# Patient Record
Sex: Male | Born: 1965 | Race: White | Smoking: Former smoker
Health system: Southern US, Community
[De-identification: ages and names within clinical notes are randomized; demographics above are authoritative.]

## PROBLEM LIST (undated history)

## (undated) DIAGNOSIS — N289 Disorder of kidney and ureter, unspecified: Secondary | ICD-10-CM

## (undated) DIAGNOSIS — K219 Gastro-esophageal reflux disease without esophagitis: Secondary | ICD-10-CM

## (undated) DIAGNOSIS — Z87442 Personal history of urinary calculi: Secondary | ICD-10-CM

## (undated) HISTORY — PX: KNEE ARTHROSCOPY: SUR90

## (undated) HISTORY — DX: Personal history of urinary calculi: Z87.442

## (undated) HISTORY — PX: ESOPHAGEAL DILATION: SHX303

---

## 2007-05-12 ENCOUNTER — Ambulatory Visit: Payer: Self-pay | Admitting: Gastroenterology

## 2008-03-19 ENCOUNTER — Ambulatory Visit: Payer: Self-pay | Admitting: Gastroenterology

## 2013-08-14 DIAGNOSIS — K219 Gastro-esophageal reflux disease without esophagitis: Secondary | ICD-10-CM | POA: Insufficient documentation

## 2013-08-14 DIAGNOSIS — E291 Testicular hypofunction: Secondary | ICD-10-CM | POA: Insufficient documentation

## 2013-08-14 DIAGNOSIS — F419 Anxiety disorder, unspecified: Secondary | ICD-10-CM | POA: Insufficient documentation

## 2015-03-01 ENCOUNTER — Other Ambulatory Visit: Payer: Self-pay | Admitting: Family Medicine

## 2015-03-01 DIAGNOSIS — R748 Abnormal levels of other serum enzymes: Secondary | ICD-10-CM

## 2015-12-19 DIAGNOSIS — G479 Sleep disorder, unspecified: Secondary | ICD-10-CM | POA: Insufficient documentation

## 2015-12-19 DIAGNOSIS — R251 Tremor, unspecified: Secondary | ICD-10-CM | POA: Insufficient documentation

## 2016-01-17 ENCOUNTER — Emergency Department: Payer: Managed Care, Other (non HMO)

## 2016-01-17 ENCOUNTER — Emergency Department
Admission: EM | Admit: 2016-01-17 | Discharge: 2016-01-18 | Disposition: A | Discharge status: Home / Self Care | Payer: Managed Care, Other (non HMO) | Attending: Emergency Medicine | Admitting: Emergency Medicine

## 2016-01-17 DIAGNOSIS — F419 Anxiety disorder, unspecified: Secondary | ICD-10-CM

## 2016-01-17 DIAGNOSIS — K2100 Gastro-esophageal reflux disease with esophagitis, without bleeding: Secondary | ICD-10-CM

## 2016-01-17 DIAGNOSIS — R109 Unspecified abdominal pain: Secondary | ICD-10-CM

## 2016-01-17 DIAGNOSIS — R079 Chest pain, unspecified: Secondary | ICD-10-CM | POA: Diagnosis present

## 2016-01-17 DIAGNOSIS — Z79899 Other long term (current) drug therapy: Secondary | ICD-10-CM | POA: Insufficient documentation

## 2016-01-17 HISTORY — DX: Disorder of kidney and ureter, unspecified: N28.9

## 2016-01-17 HISTORY — DX: Gastro-esophageal reflux disease without esophagitis: K21.9

## 2016-01-17 LAB — BASIC METABOLIC PANEL
Anion gap: 7 (ref 5–15)
BUN: 13 mg/dL (ref 6–20)
CHLORIDE: 102 mmol/L (ref 101–111)
CO2: 27 mmol/L (ref 22–32)
Calcium: 9.2 mg/dL (ref 8.9–10.3)
Creatinine, Ser: 1.1 mg/dL (ref 0.61–1.24)
GFR calc Af Amer: >60 mL/min (ref >60)
GFR calc non Af Amer: >60 mL/min (ref >60)
GLUCOSE: 107 mg/dL — AB (ref 65–99)
POTASSIUM: 3.8 mmol/L (ref 3.5–5.1)
Sodium: 136 mmol/L (ref 135–145)

## 2016-01-17 LAB — LIPASE, BLOOD: Lipase: 24 U/L (ref 11–51)

## 2016-01-17 LAB — TROPONIN I
Troponin I: <0.03 ng/mL (ref <0.03)
Troponin I: <0.03 ng/mL (ref <0.03)

## 2016-01-17 LAB — CBC
HEMATOCRIT: 42.8 % (ref 40.0–52.0)
Hemoglobin: 14.8 g/dL (ref 13.0–18.0)
MCH: 32.2 pg (ref 26.0–34.0)
MCHC: 34.6 g/dL (ref 32.0–36.0)
MCV: 93.2 fL (ref 80.0–100.0)
Platelets: 219 10*3/uL (ref 150–440)
RBC: 4.6 MIL/uL (ref 4.40–5.90)
RDW: 13.6 % (ref 11.5–14.5)
WBC: 9.8 10*3/uL (ref 3.8–10.6)

## 2016-01-17 LAB — HEPATIC FUNCTION PANEL
ALK PHOS: 60 U/L (ref 38–126)
ALT: 57 U/L (ref 17–63)
AST: 34 U/L (ref 15–41)
Albumin: 4.1 g/dL (ref 3.5–5.0)
BILIRUBIN DIRECT: 0.1 mg/dL (ref 0.1–0.5)
BILIRUBIN INDIRECT: 0.6 mg/dL (ref 0.3–0.9)
Total Bilirubin: 0.7 mg/dL (ref 0.3–1.2)
Total Protein: 7.6 g/dL (ref 6.5–8.1)

## 2016-01-17 LAB — GLUCOSE, CAPILLARY: Glucose-Capillary: 102 mg/dL — ABNORMAL HIGH (ref 65–99)

## 2016-01-17 MED ORDER — GI COCKTAIL ~~LOC~~
30.0000 mL | Freq: Once | ORAL | Status: AC
  Administered 2016-01-17: 30 mL via ORAL

## 2016-01-17 MED ORDER — HYDROMORPHONE HCL 1 MG/ML IJ SOLN
1.0000 mg | Freq: Once | INTRAMUSCULAR | Status: AC
  Administered 2016-01-17: 1 mg via INTRAVENOUS

## 2016-01-17 MED ORDER — OXYCODONE-ACETAMINOPHEN 5-325 MG PO TABS
1.0000 | ORAL_TABLET | ORAL | Status: DC | PRN
  Administered 2016-01-17: 1 via ORAL

## 2016-01-17 MED ORDER — IOPAMIDOL (ISOVUE-370) INJECTION 76%
75.0000 mL | Freq: Once | INTRAVENOUS | Status: AC | PRN
  Administered 2016-01-17: 75 mL via INTRAVENOUS
  Filled 2016-01-17: qty 75

## 2016-01-17 MED ORDER — LORAZEPAM 2 MG/ML IJ SOLN
1.0000 mg | Freq: Once | INTRAMUSCULAR | Status: AC
  Administered 2016-01-17: 1 mg via INTRAVENOUS

## 2016-01-17 MED ORDER — OXYCODONE-ACETAMINOPHEN 5-325 MG PO TABS
ORAL_TABLET | ORAL | Status: AC
  Administered 2016-01-17: 1 via ORAL
  Filled 2016-01-17: qty 1

## 2016-01-17 MED ORDER — KETOROLAC TROMETHAMINE 30 MG/ML IJ SOLN
INTRAMUSCULAR | Status: AC
  Filled 2016-01-17: qty 1

## 2016-01-17 MED ORDER — GI COCKTAIL ~~LOC~~
ORAL | Status: AC
  Filled 2016-01-17: qty 30

## 2016-01-17 MED ORDER — LORAZEPAM 2 MG/ML IJ SOLN
INTRAMUSCULAR | Status: AC
  Administered 2016-01-17: 1 mg via INTRAVENOUS
  Filled 2016-01-17: qty 1

## 2016-01-17 MED ORDER — KETOROLAC TROMETHAMINE 30 MG/ML IJ SOLN
30.0000 mg | Freq: Once | INTRAMUSCULAR | Status: AC
  Administered 2016-01-17: 30 mg via INTRAVENOUS

## 2016-01-17 MED ORDER — HYDROMORPHONE HCL 1 MG/ML IJ SOLN
INTRAMUSCULAR | Status: AC
  Filled 2016-01-17: qty 1

## 2016-01-17 MED ORDER — SODIUM CHLORIDE 0.9 % IV BOLUS (SEPSIS)
1000.0000 mL | Freq: Once | INTRAVENOUS | Status: AC
  Administered 2016-01-17: 1000 mL via INTRAVENOUS

## 2016-01-17 NOTE — ED Notes (Signed)
Patient transported to CT 

## 2016-01-17 NOTE — ED Provider Notes (Addendum)
[facility] Emergency Department Provider Note  ____________________________________________   I have reviewed the triage vital signs and the nursing notes.   HISTORY  Chief Complaint Chest Pain    HPI Jared Wright is a 50 y.o. male who presents today complaining of chest pain. He's had it since yesterday. Gradual onset. Patient does have a history of generalized anxiety disorder and panic attacks but states "this is not panic" he states he has been "trained to deal with pain"via martial arts and the [employer], and he is not worried about the pain. He states he cannot take a deep breath. The pain itself is not pleuritic but he feels he cannot fill his lungs. Patient very anxious. He states that he has had this discomfort which is "all over his chest" which goes towards his neck and down towards his stomach since yesterday. It has never gone away. He states that he has had no fever no chills and no cough. The pain is a "pressure" also a "knot". It hurts when he changes position or pulls up on the bed. "Everything makes it worse"  He does, however, not endorse any exertional symptoms. Mostly, it seems to be positional. Neither does he endorse pain with food. He denies any fall or trauma he denies any abdominal pain especially any lower abdominal pain. He denies any change in bowel or bladder. Denies any cough or vomiting.    Past Medical History:  Diagnosis Date  . Acid reflux   . Renal disorder     There are no active problems to display for this patient.   History reviewed. No pertinent surgical history.  Prior to Admission medications   Not on File    Allergies Sulfur  No family history on file.  Social History Social History  Substance Use Topics  . Smoking status: Never Smoker  . Smokeless tobacco: Not on file  . Alcohol use No    Review of Systems Constitutional: No fever/chills Eyes: No visual changes. ENT: No sore throat. No  stiff neck no neck pain Cardiovascular: See history of present illness regarding chest pain. Respiratory: See history of present illness regarding shortness of breath. Gastrointestinal:   no vomiting.  No diarrhea.  No constipation. Genitourinary: Negative for dysuria. Musculoskeletal: Negative lower extremity swelling Skin: Negative for rash. Neurological: Negative for severe headaches, focal weakness or numbness. 10-point ROS otherwise negative.  ____________________________________________   PHYSICAL EXAM:  VITAL SIGNS: ED Triage Vitals  Enc Vitals Group     BP 01/17/16 1815 (!) 181/79     Pulse Rate 01/17/16 1815 93     Resp 01/17/16 1815 20     Temp 01/17/16 1815 97.8 F (36.6 C)     Temp Source 01/17/16 1815 Oral     SpO2 01/17/16 1815 99 %     Weight 01/17/16 1818 210 lb (95.3 kg)     Height 01/17/16 1818 5\' 8"  (1.727 m)     Head Circumference --      Peak Flow --      Pain Score 01/17/16 1818 8     Pain Loc --      Pain Edu? --      Excl. in [place]? --     Constitutional: Alert and oriented. She is in no medical distress. Is very anxious and upset breathing quickly and quite clearly demonstrating signs of anxiety. Eyes: Conjunctivae are normal. PERRL. EOMI. Head: Atraumatic. Nose: No congestion/rhinnorhea. Mouth/Throat: Mucous membranes are moist.  Oropharynx non-erythematous. Neck:  No stridor.   Nontender with no meningismus Cardiovascular: Tachycardia noted, regular rhythm. Grossly normal heart sounds.  Good peripheral circulation. Respiratory: Normal respiratory effort.  No retractions. Lungs CTAB. Patient is breathing more quickly than normal. Chest: Palpation of the patient's chest, he says "ouch that's the pain right there" and pulls back. This is in both costochondral margins anteriorly near the sternum. No chest no crepitus no evidence of acute lesions. Abdominal: Soft and minimal tenderness to palpation in epigastric right upper quadrant. No distention. No  guarding no rebound Back:  There is no focal tenderness or step off.  there is no midline tenderness there are no lesions noted. there is no CVA tenderness Musculoskeletal: No lower extremity tenderness, no upper extremity tenderness. No joint effusions, no DVT signs strong distal pulses no edema Neurologic:  Normal speech and language. No gross focal neurologic deficits are appreciated.  Skin:  Skin is warm, dry and intact. No rash noted. Psychiatric: Mood and affect are normal. Speech and behavior are normal.  ____________________________________________   LABS (all labs ordered are listed, but only abnormal results are displayed)  Labs Reviewed  BASIC METABOLIC PANEL - Abnormal; Notable for the following:       Result Value   Glucose, Bld 107 (*)    All other components within normal limits  GLUCOSE, CAPILLARY - Abnormal; Notable for the following:    Glucose-Capillary 102 (*)    All other components within normal limits  CBC  TROPONIN I  HEPATIC FUNCTION PANEL  LIPASE, BLOOD   ____________________________________________  EKG  I personally interpreted any EKGs ordered by me or triage Sinus rhythm at 96 bpm in no acute ST elevation or depression, borderline LAD, no acute ischemic changes. ____________________________________________  [id number]  I reviewed any imaging ordered by me or triage that were performed during my shift and, if possible, patient and/or family made aware of any abnormal findings. ____________________________________________   PROCEDURES  Procedure(s) performed: None  Procedures  Critical Care performed: None  ____________________________________________   INITIAL IMPRESSION / ASSESSMENT AND PLAN / ED COURSE  Pertinent labs & imaging results that were available during my care of the patient were reviewed by me and considered in my medical decision making (see chart for details).  The patient has some minimal epigastric and right upper  quadrant discomfort but also is complaining about severe chest pain, he is also clearly very anxious and upset. Troponin is negative despite constant pain since yesterday, leading one to suspect that patient is not having an acute coronary event. EKG is also reassuring. The patient's vital signs reflect possible anxiety attack.   Of course PE or dissection is also on the differential especially since he keeps complaining that he cannot take a deep breath. I have seen this complaint many times and anxiety patient I have not often seen at an PE patients but we'll obtain a CT scan given the dramatic presentation as there is no way we can rule it out without. Patient does have low pretest probability for PE. He has no history of PE or DVT in himself or family no leg swelling no recent travel no exogenous estrogens no surgery no neurologic processes etc. However, he is having severe chest pain as he describes it with shortness of breath. Dissection is also on the differential but again low suspicion. Patient may be having gallbladder problems we'll send liver fraction tests and lipase as a screening test, however I think first we will rule out acute intrathoracic pathology given  his presentation. To my exam however this feels more consistent with possible stomach/gastric/esophagus and/or panic issues. However his abdomen is nonsurgical. He also has reproducible chest wall pain which of course could be the cause  ----------------------------------------- 9:43 PM on 01/17/2016 -----------------------------------------  At this time, there does not appear to be clinical evidence to support the diagnosis of pulmonary embolus, dissection, myocarditis, endocarditis, pericarditis, pericardial tamponade, acute coronary syndrome, pneumothorax, pneumonia, or any other acute intrathoracic pathology that will require admission or acute intervention. Nor is there evidence of any significant intra-abdominal pathology causing  this discomfort.   We have sent a second troponin which I anticipate will likely be negative. I did give the patient a full milligram of Dilaudid which is a strong intravenous pain medication, and he states "it did not touch my pain". He states it started to make him feel better and then he changed position the wrong way and the pain came back. Pain is still in the exact center of his chest. This is reproducible chest wall pain. He is adamant that that what I can reproduce is not the actual cause of his pain.   We'll try other medications to see if we can calm this discomfort down but again severe chest pain in the mid chest with negative CT scan and negative EKG and negative troponin after 2 days is very difficult to account for without recourse to likely muscular skeletal issue. Abdomen is completely benign with no evidence of discomfort after Dilaudid.. CT findings are noted. No evidence of this is causing his pain. Liver function tests are normal lipase is normal white count is normal metabolic panel is normal glucoses 102, we will continue to observe the patient in the emergency department.   Patient has also had multiple different visits to his family medical doctor for reflux disease. He has a history of a hiatal hernia we'll also try GI cocktail. As well as anti-inflammatories and I will try an anxiolytic. Patient insisting that he cannot go home because he is in severe pain that apparently does not respond to Dilaudid.Patient has been seen by his primary care doctor for being "shaky" in the past being worked up for shakiness. Also been evaluated for vision changing in multiple different other possibly somatoform pathologies but today, his complaint is severe chest pain   ----------------------------------------- 9:59 PM on 01/17/2016 -----------------------------------------  Second troponin is negative, we will obtain ultrasound of the gallbladder as a precaution but again low suspicion. The  rest of the abdominal exam is completely benign. Patient is not actually complaining of abdominal pain is complaining of mid to upper chest pain. I have a explained to him that we may not be able to find the cause of his discomfort, however it is our goal to try to rule out significant pathology and thus far we have been able to do so. Clinically, my suspicion for any acute intrathoracic or abdominal pathology that is going to require admission or further intervention is very low.  ----------------------------------------- 10:13 PM on 01/17/2016 -----------------------------------------  Patient now recalls that he has had esophageal spasm and esophageal strictures in the past. However he has no evidence of obstruction. This will be stopping he will follow-up as an outpatient. He is feeling much better after Ativan, he is much more relaxed, he feels that he can breathe better. Signed out to Dr. Archie Balboa at the end of my shift. Patient pending ultrasound.  Clinical Course    ____________________________________________   FINAL CLINICAL IMPRESSION(S) / ED DIAGNOSES  Final diagnoses:  None      This chart was dictated using voice recognition software.  Despite best efforts to proofread,  errors can occur which can change meaning.      Schuyler Amor, MD 01/17/16 2100    Schuyler Amor, MD 01/17/16 2122    Schuyler Amor, MD 01/17/16 2140    Schuyler Amor, MD 01/17/16 2145    Schuyler Amor, MD 01/17/16 2152    Schuyler Amor, MD 01/17/16 [place], MD 01/17/16 2214

## 2016-01-17 NOTE — ED Notes (Signed)
Pt very anxious in triage. Percocet given for pain and VS rechecked and WDL at this time.

## 2016-01-17 NOTE — ED Triage Notes (Signed)
Pt c/o chest pain that began yesterday to center of chest, constant and pressure feeling. Pain to jaw and also "up into neck". Hx of similar episodes, but " I haven't been to doctor for it". Marland Kitchen

## 2016-01-18 MED ORDER — SUCRALFATE 1 GM/10ML PO SUSP: 1.0000 g | Freq: Four times a day (QID) | ORAL | 0 refills | Status: DC

## 2016-01-18 MED ORDER — SUCRALFATE 1 G PO TABS
1.0000 g | ORAL_TABLET | Freq: Once | ORAL | Status: AC
  Administered 2016-01-18: 1 g via ORAL
  Filled 2016-01-18: qty 1

## 2016-01-18 MED ORDER — LORAZEPAM 1 MG PO TABS: 1.0000 mg | ORAL_TABLET | Freq: Three times a day (TID) | ORAL | 0 refills | Status: DC | PRN

## 2016-01-18 NOTE — ED Provider Notes (Signed)
-----------------------------------------   1:00 AM on 01/18/2016 -----------------------------------------  US abdomen interpreted per Dr. Francoise Ceo:  1. No sonographic evidence for cholelithiasis or acute  cholecystitis.  2. Fatty liver   Patient is overall feeling better. Updated him of negative ultrasound results. He currently takes Protonix one to 2 times daily depending on his symptoms. Will add Carafate, limited quantity anxiolytic and patient will follow-up with cardiology as well as GI. Strict return precautions given. Patient verbalizes understanding and agrees with plan of care.   Paulette Blanch, MD 01/18/16 [phone]

## 2016-01-18 NOTE — Discharge Instructions (Signed)
1. Start Carafate 4 times daily with meals. 2. You may take Ativan as needed for anxiety. 3. Return to the ER for worsening symptoms, persistent vomiting, difficulty breathing or other concerns.

## 2016-01-18 NOTE — ED Notes (Signed)
Patient returned from Ultrasound. 

## 2016-01-28 DIAGNOSIS — R569 Unspecified convulsions: Secondary | ICD-10-CM | POA: Insufficient documentation

## 2017-06-01 DIAGNOSIS — M25572 Pain in left ankle and joints of left foot: Secondary | ICD-10-CM | POA: Diagnosis not present

## 2017-06-01 DIAGNOSIS — S93402A Sprain of unspecified ligament of left ankle, initial encounter: Secondary | ICD-10-CM | POA: Diagnosis not present

## 2017-06-01 DIAGNOSIS — M7712 Lateral epicondylitis, left elbow: Secondary | ICD-10-CM | POA: Diagnosis not present

## 2017-11-05 DIAGNOSIS — E291 Testicular hypofunction: Secondary | ICD-10-CM | POA: Diagnosis not present

## 2017-11-24 DIAGNOSIS — B86 Scabies: Secondary | ICD-10-CM | POA: Diagnosis not present

## 2017-12-24 ENCOUNTER — Ambulatory Visit (INDEPENDENT_AMBULATORY_CARE_PROVIDER_SITE_OTHER): Payer: 59 | Admitting: Urology

## 2017-12-24 ENCOUNTER — Encounter: Payer: Self-pay | Admitting: Urology

## 2017-12-24 VITALS — BP 143/70 | HR 79 | Ht 68.0 in | Wt 192.0 lb

## 2017-12-24 DIAGNOSIS — E291 Testicular hypofunction: Secondary | ICD-10-CM | POA: Diagnosis not present

## 2017-12-24 NOTE — Progress Notes (Signed)
12/24/2017 2:02 PM   Jared Wright 05-31-1965 [id number]  Referring provider: Sofie Hartigan, MD [address] [place], [place] [address]  Chief Complaint  Patient presents with  . Hypogonadism    HPI: 52 year old male seen in consultation at the request of Dr. Ellison Hughs for evaluation and management of hypogonadism.  He was diagnosed with hypogonadism back in 2015 and was on TRT at [facility] for approximately 2 years which she subsequently discontinued.  He has recently noted worsening tiredness, fatigue and decreased libido.  He has a new girlfriend and wanted to start back on therapy.  He has mild erectile dysfunction.  A total testosterone level was low at 239 ng/dL drawn on 11/05/2017.  His free testosterone was low normal at 7.5.    He has no bothersome lower urinary tract symptoms.  Denies dysuria or gross hematuria.  Denies flank, abdominal, pelvic or scrotal pain.   PMH: Past Medical History:  Diagnosis Date  . Acid reflux   . History of kidney stones   . Renal disorder     Surgical History: No past surgical history on file.  Home Medications:  Allergies as of 12/24/2017      Reactions   Ciprofloxacin    Other reaction(s): Unknown   Sulfur    Sulfamethoxazole-trimethoprim Palpitations   Also noted flu like aches      Medication List        Accurate as of 12/24/17  2:02 PM. Always use your most recent med list.          ibuprofen 800 MG tablet Commonly known as:  ADVIL,MOTRIN Take 800 mg by mouth every 8 (eight) hours as needed.       Allergies:  Allergies  Allergen Reactions  . Ciprofloxacin     Other reaction(s): Unknown  . Sulfur   . Sulfamethoxazole-Trimethoprim Palpitations    Also noted flu like aches    Family History: No family history on file.  Social History:  reports that he has quit smoking. His smoking use included cigarettes. He quit after 5.00 years of use. He has never used smokeless tobacco. He reports that  he drank alcohol. He reports that he does not use drugs.  ROS: UROLOGY Frequent Urination?: No Hard to postpone urination?: No Burning/pain with urination?: No Get up at night to urinate?: No Leakage of urine?: No Urine stream starts and stops?: No Trouble starting stream?: No Do you have to strain to urinate?: No Blood in urine?: No Urinary tract infection?: No Sexually transmitted disease?: No Injury to kidneys or bladder?: No Painful intercourse?: No Weak stream?: No Erection problems?: Yes Penile pain?: No  Gastrointestinal Nausea?: No Vomiting?: No Indigestion/heartburn?: Yes Diarrhea?: No Constipation?: No  Constitutional Fever: No Night sweats?: No Weight loss?: No Fatigue?: No  Skin Skin rash/lesions?: No Itching?: No  Eyes Blurred vision?: No Double vision?: No  Ears/Nose/Throat Sore throat?: No Sinus problems?: No  Hematologic/Lymphatic Swollen glands?: No Easy bruising?: No  Cardiovascular Leg swelling?: No Chest pain?: No  Respiratory Cough?: No Shortness of breath?: No  Endocrine Excessive thirst?: No  Musculoskeletal Back pain?: Yes Joint pain?: Yes  Neurological Headaches?: No Dizziness?: No  Psychologic Depression?: No Anxiety?: No  Physical Exam: BP (!) 143/70 (BP Location: Left Arm, Patient Position: Sitting, Cuff Size: Large)   Pulse 79   Ht 5\' 8"  (1.727 m)   Wt 192 lb (87.1 kg)   BMI 29.19 kg/m   Constitutional:  Alert and oriented, No acute distress. HEENT:  [place] AT, moist mucus membranes.  Trachea midline, no masses. Cardiovascular: No clubbing, cyanosis, or edema. Respiratory: Normal respiratory effort, no increased work of breathing. GI: Abdomen is soft, nontender, nondistended, no abdominal masses GU: No CVA tenderness.  Phallus circumcised without lesions, testes descended bilateral without masses or tenderness.  No atrophy noted.  No paratesticular abnormalities. Lymph: No cervical or inguinal  lymphadenopathy. Skin: No rashes, bruises or suspicious lesions. Neurologic: Grossly intact, no focal deficits, moving all 4 extremities. Psychiatric: Normal mood and affect.   Assessment & Plan:   52 year old male with symptomatic hypogonadism.  He desires to restart testosterone replacement.  Potential side effects of testosterone replacement were discussed including stimulation of benign prostatic growth with lower urinary tract symptoms; erythrocytosis; edema; gynecomastia; worsening sleep apnea; venous thromboembolism; testicular atrophy and infertility. Recent studies suggesting an increased incidence of heart attack and stroke in patients taking testosterone was discussed. He was informed there is conflicting evidence regarding the impact of testosterone therapy on cardiovascular risk. The theoretical risk of growth stimulation of an undetected prostate cancer was also discussed.  He was informed that current evidence does not provide any definitive answers regarding the risks of testosterone therapy on prostate cancer and cardiovascular disease. The need for periodic monitoring of his testosterone level, PSA, hematocrit and DRE was discussed.  An LH level and baseline PSA were drawn today.  If his LH is mid range to low normal he would also be a candidate for Clomid.  He was also given information on Xyosted if he desires a subcutaneous autoinjector.    Abbie Sons, [place] [address] [place], [place] [address] [phone]

## 2017-12-25 LAB — LUTEINIZING HORMONE: LH: 1.9 m[IU]/mL (ref 1.7–8.6)

## 2017-12-25 LAB — PSA: PROSTATE SPECIFIC AG, SERUM: 0.7 ng/mL (ref 0.0–4.0)

## 2017-12-28 ENCOUNTER — Telehealth: Payer: Self-pay | Admitting: Family Medicine

## 2017-12-28 MED ORDER — CLOMIPHENE CITRATE 50 MG PO TABS: 25.0000 mg | ORAL_TABLET | Freq: Every day | ORAL | 1 refills | Status: DC

## 2017-12-28 NOTE — Telephone Encounter (Signed)
Patient notified and will try the Clomid.

## 2017-12-28 NOTE — Telephone Encounter (Signed)
Rx sent follow-up 6 weeks

## 2017-12-28 NOTE — Telephone Encounter (Signed)
-----   Message from Abbie Sons, MD sent at 12/27/2017  9:04 AM EST ----- LH level was low normal at 1.9.  I would recommend an initial trial of Clomid instead of exogenous testosterone.  If he is agreeable will send an Rx and would recommend a 6-week follow-up with testosterone level.

## 2017-12-28 NOTE — Telephone Encounter (Signed)
Patient notified and voiced understanding.

## 2017-12-31 ENCOUNTER — Telehealth: Payer: Self-pay | Admitting: Urology

## 2017-12-31 NOTE — Telephone Encounter (Signed)
A 89-month supply of clomiphene on good Rx is $30.  The only other option would be testosterone and will need to find out what the preferred testosterone preparation is on his insurance plan.

## 2017-12-31 NOTE — Telephone Encounter (Signed)
Pt lmom asking for something cheaper than Clomid that was prescribed at his appt. States his insurance does not cover it, asks to send something else in that's cheaper. Pt did not leave pharm details. Please advise. Thanks .

## 2017-12-31 NOTE — Telephone Encounter (Signed)
Please advise 

## 2018-01-03 MED ORDER — CLOMIPHENE CITRATE 50 MG PO TABS: 25.0000 mg | ORAL_TABLET | Freq: Every day | ORAL | 1 refills | Status: DC

## 2018-01-03 NOTE — Telephone Encounter (Signed)
Patient notified and medication was moved to [facility].

## 2018-01-11 MED ORDER — SILDENAFIL CITRATE 20 MG PO TABS: ORAL_TABLET | ORAL | 0 refills | Status: DC

## 2018-01-11 NOTE — Telephone Encounter (Signed)
Clomid usually takes 4 to 6 weeks before he will note any improvement in his libido.  Rx sildenafil was sent to his pharmacy.

## 2018-01-11 NOTE — Telephone Encounter (Signed)
Patient called the office this morning.  He is requesting a Rx for something that will help him during the night with erectile dysfunction. He has started taking the Clomid, but does not have the sexual desire.  He has used Viagra in the past and seems to work well for him.  Do you have any recommendation?  He would like the Rx sent to [facility] in [place]. Patient can be reached at [phone].

## 2018-01-11 NOTE — Addendum Note (Signed)
Addended by: John Giovanni C on: 01/11/2018 02:08 PM   Modules accepted: Orders

## 2018-01-12 ENCOUNTER — Telehealth: Payer: Self-pay | Admitting: Urology

## 2018-01-12 MED ORDER — SILDENAFIL CITRATE 20 MG PO TABS: ORAL_TABLET | ORAL | 0 refills | Status: DC

## 2018-01-12 NOTE — Telephone Encounter (Signed)
Rx sent in yesterday at [facility] was denied by [employer], can only give 4 pills per month. Pt would like to have Rx sent to [facility] so he can use his Good [facility] coupon. Please advise. Thanks.

## 2018-01-12 NOTE — Telephone Encounter (Signed)
Pt notified that rx was re-sent to [facility]. He had no additional questions at this time. Nothing further is needed.

## 2018-01-20 DIAGNOSIS — D2262 Melanocytic nevi of left upper limb, including shoulder: Secondary | ICD-10-CM | POA: Diagnosis not present

## 2018-01-20 DIAGNOSIS — D2261 Melanocytic nevi of right upper limb, including shoulder: Secondary | ICD-10-CM | POA: Diagnosis not present

## 2018-01-20 DIAGNOSIS — D225 Melanocytic nevi of trunk: Secondary | ICD-10-CM | POA: Diagnosis not present

## 2018-01-20 DIAGNOSIS — D485 Neoplasm of uncertain behavior of skin: Secondary | ICD-10-CM | POA: Diagnosis not present

## 2018-03-02 ENCOUNTER — Ambulatory Visit (INDEPENDENT_AMBULATORY_CARE_PROVIDER_SITE_OTHER): Payer: 59 | Admitting: Urology

## 2018-03-02 ENCOUNTER — Encounter: Payer: Self-pay | Admitting: Urology

## 2018-03-02 ENCOUNTER — Telehealth: Payer: Self-pay | Admitting: Urology

## 2018-03-02 VITALS — BP 144/84 | HR 85 | Ht 68.0 in | Wt 187.0 lb

## 2018-03-02 DIAGNOSIS — E291 Testicular hypofunction: Secondary | ICD-10-CM | POA: Diagnosis not present

## 2018-03-02 MED ORDER — TESTOSTERONE ENANTHATE 75 MG/0.5ML ~~LOC~~ SOAJ: 75.0000 mg | SUBCUTANEOUS | 3 refills | Status: DC

## 2018-03-02 NOTE — Telephone Encounter (Signed)
Pt said this was going to cost $500  for Xyosted auto injector.  He would like to try something different.  Please call pt with alternatives.

## 2018-03-02 NOTE — Progress Notes (Signed)
03/02/2018 1:58 PM   Jared Wright 07/23/1965 [id number]  Referring provider: Sofie Hartigan, MD [place], [place] [address]  Chief Complaint  Patient presents with  . Hypogonadism    medication follow up    HPI: 53 year-old male presents for follow-up of hypogonadism.  He had been on topical testosterone in 2015 and was seen in November 2019 wanting to restart treatment.  His LH was low normal and he was interested in a trial of Clomid.  He has been taking this medication for approximately 6 weeks.  He has seen no significant improvement in his energy level and libido.  He has had no side effects.   PMH: Past Medical History:  Diagnosis Date  . Acid reflux   . History of kidney stones   . Renal disorder     Surgical History: No past surgical history on file.  Home Medications:  Allergies as of 03/02/2018      Reactions   Ciprofloxacin    Other reaction(s): Unknown   Sulfur    Sulfamethoxazole-trimethoprim Palpitations   Also noted flu like aches      Medication List       Accurate as of March 02, 2018  1:58 PM. Always use your most recent med list.        clomiPHENE 50 MG tablet Commonly known as:  CLOMID Take 0.5 tablets (25 mg total) by mouth daily.   ibuprofen 800 MG tablet Commonly known as:  ADVIL,MOTRIN Take 800 mg by mouth every 8 (eight) hours as needed.   sildenafil 20 MG tablet Commonly known as:  REVATIO 2-5 tabs 1 hour prior to intercourse   Testosterone Enanthate 75 MG/0.5ML Soaj Commonly known as:  XYOSTED Inject 75 mg into the skin once a week.       Allergies:  Allergies  Allergen Reactions  . Ciprofloxacin     Other reaction(s): Unknown  . Sulfur   . Sulfamethoxazole-Trimethoprim Palpitations    Also noted flu like aches    Family History: No family history on file.  Social History:  reports that he has quit smoking. His smoking use included cigarettes. He quit after 5.00 years of use. He has  never used smokeless tobacco. He reports previous alcohol use. He reports that he does not use drugs.  ROS: UROLOGY Frequent Urination?: No Hard to postpone urination?: No Burning/pain with urination?: No Get up at night to urinate?: No Leakage of urine?: No Urine stream starts and stops?: No Trouble starting stream?: No Do you have to strain to urinate?: No Blood in urine?: No Urinary tract infection?: No Sexually transmitted disease?: No Injury to kidneys or bladder?: No Painful intercourse?: No Weak stream?: No Erection problems?: Yes Penile pain?: No  Gastrointestinal Nausea?: No Vomiting?: No Indigestion/heartburn?: No Diarrhea?: No Constipation?: No  Constitutional Fever: No Night sweats?: No Weight loss?: No Fatigue?: No  Skin Skin rash/lesions?: No Itching?: No  Eyes Blurred vision?: No Double vision?: No  Ears/Nose/Throat Sore throat?: No Sinus problems?: No  Hematologic/Lymphatic Swollen glands?: No Easy bruising?: No  Cardiovascular Leg swelling?: No Chest pain?: No  Respiratory Cough?: No Shortness of breath?: No  Endocrine Excessive thirst?: No  Musculoskeletal Back pain?: No Joint pain?: No  Neurological Headaches?: No Dizziness?: No  Psychologic Depression?: No Anxiety?: No  Physical Exam: BP (!) 144/84 (BP Location: Left Arm, Patient Position: Sitting)   Pulse 85   Ht 5\' 8"  (1.727 m)   Wt 187 lb (84.8 kg)  BMI 28.43 kg/m   Constitutional:  Alert and oriented, No acute distress. HEENT: [place] AT, moist mucus membranes.  Trachea midline, no masses. Cardiovascular: No clubbing, cyanosis, or edema. Respiratory: Normal respiratory effort, no increased work of breathing. Neurologic: Grossly intact, no focal deficits, moving all 4 extremities. Psychiatric: Normal mood and affect.   Assessment & Plan:    1. Hypogonadism in male No significant symptomatic improvement on Clomid.  Testosterone level was drawn today.  He  would like to start back on TRT.  He was particularly interested in [place] and was given a printed prescription at his request to use with good Rx.  Recommend 6-week follow-up for symptom check and repeat testosterone level.  Return in about 6 weeks (around 04/13/2018) for Recheck.   Abbie Sons, [place] [address] [place], [place] [address] [phone]

## 2018-03-03 LAB — TESTOSTERONE: TESTOSTERONE: 979 ng/dL — AB (ref 264–916)

## 2018-03-03 NOTE — Telephone Encounter (Signed)
Pt called again and still hasn't heard anything about auto injectior.  I told him clinical staff is waiting to hear back from Dr. Bernardo Heater.

## 2018-03-04 NOTE — Telephone Encounter (Signed)
His only other options would be IM injections or topical testosterone gel.

## 2018-03-04 NOTE — Telephone Encounter (Signed)
Returned call to patient. He states he would like to try injections since he did not do well with the gel. Please advise next step.

## 2018-03-06 MED ORDER — TESTOSTERONE CYPIONATE 200 MG/ML IM SOLN: 100.0000 mg | INTRAMUSCULAR | 0 refills | Status: DC

## 2018-03-06 NOTE — Telephone Encounter (Signed)
See result note. I couldn't escribe, had to print rx

## 2018-03-07 NOTE — Telephone Encounter (Signed)
-----   Message from Abbie Sons, MD sent at 03/06/2018 10:42 AM EST ----- Testosterone level on Clomid was 979.  He was going to try testosterone.  Rx was sent to his pharmacy.  He needs to be scheduled for injection training.  He is scheduled for a follow-up with me in late February.

## 2018-03-07 NOTE — Telephone Encounter (Signed)
Left message to return call.  Mychart message was sent to patient with lab result and  instructions to contact the office to schedule appt for injection training.

## 2018-03-09 ENCOUNTER — Ambulatory Visit (INDEPENDENT_AMBULATORY_CARE_PROVIDER_SITE_OTHER): Payer: 59 | Admitting: Urology

## 2018-03-09 DIAGNOSIS — E291 Testicular hypofunction: Secondary | ICD-10-CM | POA: Diagnosis not present

## 2018-03-09 MED ORDER — TESTOSTERONE CYPIONATE 200 MG/ML IM SOLN
100.0000 mg | Freq: Once | INTRAMUSCULAR | Status: AC
  Administered 2018-03-09: 100 mg via INTRAMUSCULAR

## 2018-03-09 NOTE — Patient Instructions (Addendum)

## 2018-03-09 NOTE — Progress Notes (Signed)
Error

## 2018-03-09 NOTE — Progress Notes (Signed)
IM Injection  Patient is present today for an IM Injection for treatment of hypogonadism in male. Drug: Testosterone Cypionate Dose:0.50ml  Location: right thigh Lot: [id number] Exp:06/2019 Patient tolerated well, no complications were noted   Patient presents today for testosterone injection training. Patient stated he did not feel comfortable injecting himself at first and at last minute asked if I could do it so he could see how it feels. After giving injection, patient stated "it wasn't as bad as I thought" and felt comfortable at that point.  Had patient stick his other leg with new needle and new empty syringe to ensure his comfort level and technique with sticking himself with needle with no push/injection. Patient verbalized understanding and written instructions were given to patient.   Preformed by: Shawnie Dapper, CMA  Additional notes/ Follow up: as scheduled in February

## 2018-04-08 ENCOUNTER — Other Ambulatory Visit: Payer: Self-pay

## 2018-04-08 DIAGNOSIS — E291 Testicular hypofunction: Secondary | ICD-10-CM

## 2018-04-11 ENCOUNTER — Other Ambulatory Visit: Payer: 59

## 2018-04-11 DIAGNOSIS — E291 Testicular hypofunction: Secondary | ICD-10-CM | POA: Diagnosis not present

## 2018-04-12 LAB — TESTOSTERONE: TESTOSTERONE: 449 ng/dL (ref 264–916)

## 2018-04-14 ENCOUNTER — Encounter: Payer: Self-pay | Admitting: Urology

## 2018-04-14 ENCOUNTER — Ambulatory Visit (INDEPENDENT_AMBULATORY_CARE_PROVIDER_SITE_OTHER): Payer: 59 | Admitting: Urology

## 2018-04-14 VITALS — BP 145/66 | HR 94 | Ht 68.0 in | Wt 187.0 lb

## 2018-04-14 DIAGNOSIS — E291 Testicular hypofunction: Secondary | ICD-10-CM

## 2018-04-14 MED ORDER — "NEEDLE (DISP) 18G X 1-1/2"" MISC": 0 refills | Status: DC

## 2018-04-14 MED ORDER — "SYRINGE/NEEDLE (DISP) 21G X 1-1/2"" 3 ML MISC": 1 refills | Status: DC

## 2018-04-14 NOTE — Progress Notes (Signed)
04/14/2018 10:22 PM   Jared Wright 1965-04-24 [id number]  Referring provider: Sofie Hartigan, MD [place], [place] [address]  Chief Complaint  Patient presents with  . Hypogonadism    HPI: 53 year old male presents for follow-up of hypogonadism.  He was initially started on Clomid and had a testosterone level in the 900 range however saw no significant improvement in his energy level and libido.  He was subsequently switched to TRT and was injecting 100 mg weekly.  He states the first week he started treatment he felt great however at week 3 began to have recurrent tiredness and fatigue.  A testosterone level drawn 04/11/2018 was 449.   PMH: Past Medical History:  Diagnosis Date  . Acid reflux   . History of kidney stones   . Renal disorder     Surgical History: No past surgical history on file.  Home Medications:  Allergies as of 04/14/2018      Reactions   Ciprofloxacin    Other reaction(s): Unknown   Sulfur    Sulfamethoxazole-trimethoprim Palpitations   Also noted flu like aches      Medication List       Accurate as of April 14, 2018 10:22 PM. Always use your most recent med list.        ibuprofen 800 MG tablet Commonly known as:  ADVIL,MOTRIN Take 800 mg by mouth every 8 (eight) hours as needed.   NEEDLE (DISP) 18 G 18G X 1-1/2" Misc Commonly known as:  BD SAFETYGLIDE NEEDLE Use to draw up medication   sildenafil 20 MG tablet Commonly known as:  REVATIO 2-5 tabs 1 hour prior to intercourse   SYRINGE-NEEDLE (DISP) 3 ML 21G X 1-1/2" 3 ML Misc Commonly known as:  SAFETY SYRINGE/NEEDLE Use to inject medication   testosterone cypionate 200 MG/ML injection Commonly known as:  DEPOTESTOSTERONE CYPIONATE Inject 0.5 mLs (100 mg total) into the muscle once a week.       Allergies:  Allergies  Allergen Reactions  . Ciprofloxacin     Other reaction(s): Unknown  . Sulfur   . Sulfamethoxazole-Trimethoprim Palpitations   Also noted flu like aches    Family History: No family history on file.  Social History:  reports that he has quit smoking. His smoking use included cigarettes. He quit after 5.00 years of use. He has never used smokeless tobacco. He reports previous alcohol use. He reports that he does not use drugs.  ROS: UROLOGY Frequent Urination?: No Hard to postpone urination?: No Burning/pain with urination?: No Get up at night to urinate?: No Leakage of urine?: No Urine stream starts and stops?: No Trouble starting stream?: No Do you have to strain to urinate?: No Blood in urine?: No Urinary tract infection?: No Sexually transmitted disease?: No Injury to kidneys or bladder?: No Painful intercourse?: No Weak stream?: No Erection problems?: No Penile pain?: No  Gastrointestinal Nausea?: No Vomiting?: No Indigestion/heartburn?: No Diarrhea?: No Constipation?: No  Constitutional Fever: No Night sweats?: No Weight loss?: No Fatigue?: No  Skin Skin rash/lesions?: No Itching?: No  Eyes Blurred vision?: No Double vision?: No  Ears/Nose/Throat Sore throat?: No Sinus problems?: No  Hematologic/Lymphatic Swollen glands?: No Easy bruising?: No  Cardiovascular Leg swelling?: No Chest pain?: No  Respiratory Cough?: No Shortness of breath?: No  Endocrine Excessive thirst?: No  Musculoskeletal Back pain?: No Joint pain?: No  Neurological Headaches?: No Dizziness?: No  Psychologic Depression?: No Anxiety?: No  Physical Exam: BP (!) 145/66 (BP Location:  Left Arm, Patient Position: Sitting, Cuff Size: Large)   Pulse 94   Ht 5\' 8"  (1.727 m)   Wt 187 lb (84.8 kg)   BMI 28.43 kg/m   Constitutional:  Alert and oriented, No acute distress. HEENT: [place] AT, moist mucus membranes.  Trachea midline, no masses. Cardiovascular: No clubbing, cyanosis, or edema. Respiratory: Normal respiratory effort, no increased work of breathing. Skin: No rashes, bruises or  suspicious lesions. Neurologic: Grossly intact, no focal deficits, moving all 4 extremities. Psychiatric: Normal mood and affect.   Assessment & Plan:   53 year old male with hypogonadism.  Will increase dosing to 140 mg weekly.  Follow-up testosterone level in 4 weeks and visit in 3 months for monitoring labs.  Return in about 3 months (around 07/13/2018) for Recheck.  Abbie Sons, [facility] [address], [place], [place] [address] [phone]

## 2018-04-14 NOTE — Progress Notes (Signed)
Rx for Syringes and Needles sent to pharmacy per patient request for Testosterone injections.

## 2018-04-15 ENCOUNTER — Encounter: Payer: Self-pay | Admitting: Urology

## 2018-04-15 MED ORDER — TESTOSTERONE CYPIONATE 200 MG/ML IM SOLN: 140.0000 mg | INTRAMUSCULAR | 0 refills | Status: DC

## 2018-04-19 ENCOUNTER — Encounter: Payer: Self-pay | Admitting: Urology

## 2018-05-11 ENCOUNTER — Telehealth: Payer: Self-pay | Admitting: Urology

## 2018-05-11 NOTE — Telephone Encounter (Signed)
Pt is coming in for labwork for testosterone this Friday.  He wants to know if he could add an extra blood test to be tested for MRSA/Staff Infection.  Please advise.

## 2018-05-11 NOTE — Telephone Encounter (Signed)
Pt returned call and I read message from [place] 

## 2018-05-11 NOTE — Telephone Encounter (Signed)
Called pt, no answer. LM for pt per DPR advising him that we would not be able to add this test on for him as we would not be able to treat for this type of infection in a urological setting. Advised pt to seek care at Urgent Care or PCP.

## 2018-05-13 ENCOUNTER — Other Ambulatory Visit (INDEPENDENT_AMBULATORY_CARE_PROVIDER_SITE_OTHER): Payer: 59

## 2018-05-13 ENCOUNTER — Other Ambulatory Visit: Payer: Self-pay

## 2018-05-13 DIAGNOSIS — E291 Testicular hypofunction: Secondary | ICD-10-CM

## 2018-05-13 NOTE — Progress Notes (Signed)
Blood drawn.

## 2018-05-14 LAB — TESTOSTERONE: Testosterone: 1327 ng/dL — ABNORMAL HIGH (ref 264–916)

## 2018-05-17 ENCOUNTER — Telehealth: Payer: Self-pay

## 2018-05-17 ENCOUNTER — Other Ambulatory Visit: Payer: Self-pay | Admitting: Urology

## 2018-05-17 DIAGNOSIS — E291 Testicular hypofunction: Secondary | ICD-10-CM

## 2018-05-17 MED ORDER — TESTOSTERONE CYPIONATE 200 MG/ML IM SOLN: 120.0000 mg | INTRAMUSCULAR | 0 refills | Status: DC

## 2018-05-17 NOTE — Telephone Encounter (Signed)
Labs ordered. Pt informed via mychart

## 2018-05-17 NOTE — Telephone Encounter (Signed)
-----   Message from Abbie Sons, MD sent at 05/17/2018  2:23 PM EDT ----- Repeat testosterone level after dose change was too high at 1327.  Would decrease the dose to 0.6 mL weekly and recheck level in 4-6 weeks.

## 2018-06-01 DIAGNOSIS — L739 Follicular disorder, unspecified: Secondary | ICD-10-CM | POA: Diagnosis not present

## 2018-06-15 ENCOUNTER — Other Ambulatory Visit: Payer: Self-pay | Admitting: Urology

## 2018-06-15 MED ORDER — SILDENAFIL CITRATE 20 MG PO TABS: ORAL_TABLET | ORAL | 0 refills | Status: DC

## 2018-06-23 DIAGNOSIS — L0291 Cutaneous abscess, unspecified: Secondary | ICD-10-CM | POA: Diagnosis not present

## 2018-06-23 DIAGNOSIS — L720 Epidermal cyst: Secondary | ICD-10-CM | POA: Diagnosis not present

## 2018-06-23 DIAGNOSIS — L738 Other specified follicular disorders: Secondary | ICD-10-CM | POA: Diagnosis not present

## 2018-06-23 DIAGNOSIS — Z7689 Persons encountering health services in other specified circumstances: Secondary | ICD-10-CM | POA: Diagnosis not present

## 2018-07-09 ENCOUNTER — Other Ambulatory Visit: Payer: Self-pay | Admitting: Urology

## 2018-07-12 MED ORDER — SILDENAFIL CITRATE 20 MG PO TABS: ORAL_TABLET | ORAL | 0 refills | Status: DC

## 2018-07-13 ENCOUNTER — Ambulatory Visit: Payer: 59 | Admitting: Urology

## 2018-07-13 ENCOUNTER — Encounter: Payer: Self-pay | Admitting: Urology

## 2018-07-18 ENCOUNTER — Other Ambulatory Visit: Payer: Self-pay | Admitting: *Deleted

## 2018-07-18 ENCOUNTER — Encounter: Payer: Self-pay | Admitting: *Deleted

## 2018-07-18 MED ORDER — "SYRINGE/NEEDLE (DISP) 21G X 1-1/2"" 3 ML MISC": 1 refills | Status: DC

## 2018-07-18 MED ORDER — "NEEDLE (DISP) 18G X 1-1/2"" MISC": 1 refills | Status: DC

## 2018-07-18 NOTE — Telephone Encounter (Signed)
He was due to have his testosterone level repeated in mid May.  I cannot refill until this is done.  If he is completely out I can give a limited refill.  Needs a lab appointment for repeat testosterone level.

## 2018-07-19 ENCOUNTER — Other Ambulatory Visit: Payer: Self-pay | Admitting: Family Medicine

## 2018-07-19 DIAGNOSIS — E291 Testicular hypofunction: Secondary | ICD-10-CM

## 2018-07-19 NOTE — Telephone Encounter (Signed)
Spoke with patient he is completely out of testosterone, next injection due on Friday. Scheduled for a lab appointment for tomorrow. Appointment rescheduled with you 08/16/2018.

## 2018-07-20 ENCOUNTER — Other Ambulatory Visit: Payer: 59

## 2018-07-20 ENCOUNTER — Other Ambulatory Visit: Payer: Self-pay

## 2018-07-20 DIAGNOSIS — E291 Testicular hypofunction: Secondary | ICD-10-CM

## 2018-07-21 ENCOUNTER — Telehealth: Payer: Self-pay

## 2018-07-21 LAB — TESTOSTERONE: Testosterone: 1070 ng/dL — ABNORMAL HIGH (ref 264–916)

## 2018-07-21 NOTE — Telephone Encounter (Signed)
-----   Message from Abbie Sons, MD sent at 07/21/2018  7:15 AM EDT ----- Testosterone level is still too elevated at 1070.  Please verify with patient what dose he is taking and when was his last injection.

## 2018-07-21 NOTE — Telephone Encounter (Signed)
mychart sent.

## 2018-07-22 ENCOUNTER — Other Ambulatory Visit: Payer: Self-pay | Admitting: Urology

## 2018-07-22 MED ORDER — "SYRINGE/NEEDLE (DISP) 21G X 1-1/2"" 3 ML MISC": 1 refills | Status: DC

## 2018-07-22 MED ORDER — "NEEDLE (DISP) 18G X 1-1/2"" MISC": 1 refills | Status: DC

## 2018-07-22 MED ORDER — TESTOSTERONE CYPIONATE 200 MG/ML IM SOLN: 100.0000 mg | INTRAMUSCULAR | 0 refills | Status: DC

## 2018-08-15 ENCOUNTER — Telehealth: Payer: Self-pay

## 2018-08-15 MED ORDER — SILDENAFIL CITRATE 20 MG PO TABS: ORAL_TABLET | ORAL | 0 refills | Status: DC

## 2018-08-15 NOTE — Telephone Encounter (Signed)
Rx refill  for Sildenafil sent to pharmacy

## 2018-08-16 ENCOUNTER — Encounter: Payer: Self-pay | Admitting: Urology

## 2018-08-16 ENCOUNTER — Ambulatory Visit: Payer: 59 | Admitting: Urology

## 2018-08-16 ENCOUNTER — Other Ambulatory Visit: Payer: Self-pay

## 2018-08-16 VITALS — BP 132/64 | HR 80 | Ht 68.0 in | Wt 176.3 lb

## 2018-08-16 DIAGNOSIS — E291 Testicular hypofunction: Secondary | ICD-10-CM | POA: Diagnosis not present

## 2018-08-16 DIAGNOSIS — N5201 Erectile dysfunction due to arterial insufficiency: Secondary | ICD-10-CM | POA: Diagnosis not present

## 2018-08-16 MED ORDER — TADALAFIL 20 MG PO TABS: ORAL_TABLET | ORAL | 0 refills | Status: DC

## 2018-08-16 NOTE — Progress Notes (Signed)
08/16/2018 10:51 AM   Jared Wright 10/29/1965 [id number]  Referring provider: Sofie Hartigan, MD [place],  [place] [address]  Chief Complaint  Patient presents with  . Hypogonadism    HPI: 53 year old male presents for follow-up of hypogonadism.  Most recently his levels have been out of the range of normal and his dose has been decreased.  He is currently injecting 0.6 mg a weekly and states he feels good with good libido and energy level.  He has no bothersome lower urinary tract symptoms, breast tenderness/enlargement or edema.  He is using generic sildenafil for ED with good efficacy.  He inquired about the differences between sildenafil and tadalafil and is interested in a trial of tadalafil.   PMH: Past Medical History:  Diagnosis Date  . Acid reflux   . History of kidney stones   . Renal disorder     Surgical History: History reviewed. No pertinent surgical history.  Home Medications:  Allergies as of 08/16/2018      Reactions   Ciprofloxacin    Other reaction(s): Unknown   Sulfur    Sulfamethoxazole-trimethoprim Palpitations   Also noted flu like aches      Medication List       Accurate as of August 16, 2018 10:51 AM. If you have any questions, ask your nurse or doctor.        B-D 3CC LUER-LOK SYR 23GX1" 23G X 1" 3 ML Misc Generic drug: SYRINGE-NEEDLE (DISP) 3 ML U UTD   doxycycline 100 MG tablet Commonly known as: VIBRA-TABS TK 1 T PO BID   ibuprofen 800 MG tablet Commonly known as: ADVIL Take 800 mg by mouth every 8 (eight) hours as needed.   Multi-Vitamin tablet Take by mouth.   mupirocin ointment 2 % Commonly known as: BACTROBAN APPLY TO THE AFFECTED AREA BID   NEEDLE (DISP) 18 G 18G X 1-1/2" Misc Commonly known as: BD SafetyGlide Needle Use to draw up medication   sildenafil 20 MG tablet Commonly known as: REVATIO 2-5 tabs 1 hour prior to intercourse   testosterone cypionate 200 MG/ML injection Commonly  known as: DEPOTESTOSTERONE CYPIONATE Inject 0.5 mLs (100 mg total) into the muscle once a week.   Testosterone Cypionate 200 MG/ML Soln       Allergies:  Allergies  Allergen Reactions  . Ciprofloxacin     Other reaction(s): Unknown  . Sulfur   . Sulfamethoxazole-Trimethoprim Palpitations    Also noted flu like aches    Family History: History reviewed. No pertinent family history.  Social History:  reports that he has quit smoking. His smoking use included cigarettes. He quit after 5.00 years of use. He has never used smokeless tobacco. He reports previous alcohol use. He reports that he does not use drugs.  ROS: UROLOGY Frequent Urination?: No Hard to postpone urination?: No Burning/pain with urination?: No Get up at night to urinate?: No Leakage of urine?: No Urine stream starts and stops?: No Trouble starting stream?: No Do you have to strain to urinate?: No Blood in urine?: No Urinary tract infection?: No Sexually transmitted disease?: No Injury to kidneys or bladder?: No Painful intercourse?: No Weak stream?: No Erection problems?: No Penile pain?: No  Gastrointestinal Nausea?: No Vomiting?: No Indigestion/heartburn?: No Diarrhea?: No Constipation?: No  Constitutional Fever: No Night sweats?: No Weight loss?: No Fatigue?: No  Skin Skin rash/lesions?: No Itching?: No  Eyes Blurred vision?: No Double vision?: No  Ears/Nose/Throat Sore throat?: No Sinus problems?: No  Hematologic/Lymphatic Swollen glands?: No Easy bruising?: No  Cardiovascular Leg swelling?: No Chest pain?: No  Respiratory Cough?: No Shortness of breath?: No  Endocrine Excessive thirst?: No  Musculoskeletal Back pain?: No Joint pain?: No  Neurological Headaches?: No Dizziness?: No  Psychologic Depression?: No Anxiety?: No  Physical Exam: BP 132/64 (BP Location: Left Arm, Patient Position: Sitting, Cuff Size: Normal)   Pulse 80   Ht 5\' 8"  (1.727 m)    Wt 176 lb 4.8 oz (80 kg)   BMI 26.81 kg/m   Constitutional:  Alert and oriented, No acute distress. HEENT: [place] AT, moist mucus membranes.  Trachea midline, no masses. Cardiovascular: No clubbing, cyanosis, or edema. Respiratory: Normal respiratory effort, no increased work of breathing. Skin: No rashes, bruises or suspicious lesions. Neurologic: Grossly intact, no focal deficits, moving all 4 extremities. Psychiatric: Normal mood and affect.   Assessment & Plan:   53 year old male with hypogonadism and ED.  He adjusted his dose approximately 1 month ago and a testosterone level was drawn today along with a hematocrit.  He is currently at midcycle.  Rx tadalafil 20 mg was also sent to his pharmacy.  Return in about 6 months (around 02/15/2019) for Recheck.   Abbie Sons, [facility] [address], [place], [place] [address] [phone]

## 2018-08-17 LAB — HEMATOCRIT: Hematocrit: 45.2 % (ref 37.5–51.0)

## 2018-08-17 LAB — TESTOSTERONE: Testosterone: 1071 ng/dL — ABNORMAL HIGH (ref 264–916)

## 2018-08-18 ENCOUNTER — Telehealth: Payer: Self-pay

## 2018-08-18 DIAGNOSIS — E291 Testicular hypofunction: Secondary | ICD-10-CM

## 2018-08-18 NOTE — Telephone Encounter (Signed)
-----   Message from Abbie Sons, MD sent at 08/18/2018  8:22 AM EDT ----- Midcycle T level still elevated at 1071.  Need to decrease to 0.5 mg weekly

## 2018-08-18 NOTE — Telephone Encounter (Signed)
Pt informed, see mychart message.

## 2018-10-03 ENCOUNTER — Encounter: Payer: Self-pay | Admitting: Urology

## 2018-10-03 ENCOUNTER — Other Ambulatory Visit: Payer: Self-pay

## 2018-10-07 ENCOUNTER — Other Ambulatory Visit: Payer: Self-pay

## 2018-10-07 ENCOUNTER — Other Ambulatory Visit: Payer: 59

## 2018-10-07 DIAGNOSIS — E291 Testicular hypofunction: Secondary | ICD-10-CM

## 2018-10-08 LAB — HEMATOCRIT: Hematocrit: 49.9 % (ref 37.5–51.0)

## 2018-10-08 LAB — TESTOSTERONE: Testosterone: 405 ng/dL (ref 264–916)

## 2018-10-10 ENCOUNTER — Telehealth: Payer: Self-pay

## 2018-10-10 NOTE — Telephone Encounter (Signed)
Pt informed via mychart

## 2018-10-10 NOTE — Telephone Encounter (Signed)
-----   Message from Abbie Sons, MD sent at 10/09/2018  9:38 AM EDT ----- Repeat testosterone level looks good at 405.  Hematocrit was 49.9.  Follow-up as scheduled

## 2018-10-14 ENCOUNTER — Telehealth: Payer: Self-pay | Admitting: Urology

## 2018-10-14 ENCOUNTER — Other Ambulatory Visit: Payer: Self-pay

## 2018-10-14 DIAGNOSIS — E291 Testicular hypofunction: Secondary | ICD-10-CM

## 2018-10-14 NOTE — Telephone Encounter (Signed)
Pt states he is out of his testosterone and needs refill, he is due for injection today. Please advise.

## 2018-10-14 NOTE — Telephone Encounter (Signed)
Request for refill already sent to provider, please see additional encounter.

## 2018-10-17 MED ORDER — TESTOSTERONE CYPIONATE 200 MG/ML IM SOLN: 100.0000 mg | INTRAMUSCULAR | 0 refills | Status: DC

## 2018-10-19 DIAGNOSIS — L723 Sebaceous cyst: Secondary | ICD-10-CM | POA: Insufficient documentation

## 2018-11-28 ENCOUNTER — Telehealth: Payer: Self-pay | Admitting: Urology

## 2018-11-28 NOTE — Telephone Encounter (Signed)
Pt is currently doing Clomid shots. He has been to dermatologist for issues with cysts, who told him it could be a side effect from testosterone shots.  He is wondering if he could try something different.  Pt definitely wants to stay on testosterone.

## 2018-11-29 NOTE — Telephone Encounter (Signed)
He is on testosterone injections not Clomid.  The only options would be going to a testosterone gel or going back on oral Clomid.

## 2018-11-29 NOTE — Telephone Encounter (Signed)
Called pt no answer. Mychart message sent.  

## 2018-11-29 NOTE — Telephone Encounter (Signed)
Please advise. Thanks.  

## 2018-11-30 ENCOUNTER — Other Ambulatory Visit: Payer: Self-pay | Admitting: Urology

## 2018-11-30 MED ORDER — CLOMIPHENE CITRATE 50 MG PO TABS: 25.0000 mg | ORAL_TABLET | Freq: Every day | ORAL | 1 refills | Status: DC

## 2018-12-28 ENCOUNTER — Telehealth: Payer: Self-pay | Admitting: Urology

## 2018-12-28 DIAGNOSIS — E291 Testicular hypofunction: Secondary | ICD-10-CM

## 2018-12-28 MED ORDER — CLOMIPHENE CITRATE 50 MG PO TABS: 25.0000 mg | ORAL_TABLET | Freq: Every day | ORAL | 1 refills | Status: DC

## 2018-12-28 NOTE — Telephone Encounter (Signed)
Pt would like Clomid 50 mg RX only changed for [facility] to [facility].  He can get RX a lot cheaper with [employer] card.

## 2018-12-28 NOTE — Telephone Encounter (Signed)
Rx changed to Jared Wright per pt's request in order to use good rx coupon

## 2019-01-02 ENCOUNTER — Other Ambulatory Visit: Payer: Self-pay

## 2019-01-02 NOTE — Telephone Encounter (Signed)
Incoming RX request for Testosterone Injections, refill denied as pt requested to be changed to Clomid.

## 2019-01-09 ENCOUNTER — Encounter: Payer: Self-pay | Admitting: Urology

## 2019-01-17 ENCOUNTER — Telehealth: Payer: Self-pay | Admitting: Urology

## 2019-01-17 NOTE — Telephone Encounter (Signed)
Pt called office asking for a stronger Rx for erectile dysfunction.  The 2 meds he has taken doesn't seem to work for him.  He's asking for something stronger and an increase in the # of pills.  Please advise pt at [phone].

## 2019-01-17 NOTE — Telephone Encounter (Signed)
At his last visit Rx tadalafil 20 mg was sent to pharmacy.  This is the strongest dose of tadalafil available.  If those medications have not been effective would recommend an appointment with Larene Beach to discuss intracavernosal injections.

## 2019-01-18 NOTE — Telephone Encounter (Signed)
See my chart message

## 2019-01-19 ENCOUNTER — Other Ambulatory Visit: Payer: Self-pay | Admitting: Urology

## 2019-01-19 MED ORDER — SILDENAFIL CITRATE 100 MG PO TABS: ORAL_TABLET | ORAL | 0 refills | Status: DC

## 2019-02-14 ENCOUNTER — Other Ambulatory Visit: Payer: Self-pay

## 2019-02-14 ENCOUNTER — Ambulatory Visit: Payer: 59 | Admitting: Urology

## 2019-02-14 ENCOUNTER — Encounter: Payer: Self-pay | Admitting: Urology

## 2019-02-14 VITALS — BP 124/68 | HR 76 | Ht 68.0 in | Wt 175.0 lb

## 2019-02-14 DIAGNOSIS — E291 Testicular hypofunction: Secondary | ICD-10-CM

## 2019-02-14 DIAGNOSIS — N529 Male erectile dysfunction, unspecified: Secondary | ICD-10-CM | POA: Diagnosis not present

## 2019-02-14 MED ORDER — "LUER LOCK SAFETY SYRINGES 21G X 1-1/2"" 3 ML MISC": 0 refills | Status: DC

## 2019-02-14 MED ORDER — "BD SAFETYGLIDE NEEDLE 18G X 1-1/2"" MISC": 1 refills | Status: DC

## 2019-02-14 MED ORDER — TESTOSTERONE CYPIONATE 200 MG/ML IM SOLN: 100.0000 mg | INTRAMUSCULAR | 0 refills | Status: DC

## 2019-02-14 NOTE — Progress Notes (Signed)
02/14/2019 9:42 AM   Jared Wright [birth year] [id number]  Referring provider: Sofie Hartigan, MD [place],  [place] [address]  Chief Complaint  Patient presents with  . Hypogonadism    HPI: 52 y.o. male presents for follow-up of hypogonadism.  He was having increased sebaceous cyst and states his dermatologist said it could be related to TRT and he switched to Clomid.  He does not feel his libido and erections are not near as good on Clomid than they were on testosterone and would like to switch back over.  He denies bothersome lower urinary tract symptoms.   PMH: Past Medical History:  Diagnosis Date  . Acid reflux   . History of kidney stones   . Renal disorder     Surgical History: History reviewed. No pertinent surgical history.  Home Medications:  Allergies as of 02/14/2019      Reactions   Ciprofloxacin    Other reaction(s): Unknown   Sulfur    Sulfamethoxazole-trimethoprim Palpitations   Also noted flu like aches      Medication List       Accurate as of February 14, 2019  9:42 AM. If you have any questions, ask your nurse or doctor.        B-D 3CC LUER-LOK SYR 23GX1" 23G X 1" 3 ML Misc Generic drug: SYRINGE-NEEDLE (DISP) 3 ML U UTD   clomiPHENE 50 MG tablet Commonly known as: CLOMID Take 0.5 tablets (25 mg total) by mouth daily.   doxycycline 100 MG tablet Commonly known as: VIBRA-TABS TK 1 T PO BID   ibuprofen 800 MG tablet Commonly known as: ADVIL Take 800 mg by mouth every 8 (eight) hours as needed.   Multi-Vitamin tablet Take by mouth.   mupirocin ointment 2 % Commonly known as: BACTROBAN APPLY TO THE AFFECTED AREA BID   NEEDLE (DISP) 18 G 18G X 1-1/2" Misc Commonly known as: BD SafetyGlide Needle Use to draw up medication   sildenafil 100 MG tablet Commonly known as: VIAGRA 1 tablet by mouth 1 hour prior to intercourse       Allergies:  Allergies  Allergen Reactions  . Ciprofloxacin     Other  reaction(s): Unknown  . Sulfur   . Sulfamethoxazole-Trimethoprim Palpitations    Also noted flu like aches    Family History: History reviewed. No pertinent family history.  Social History:  reports that he has quit smoking. His smoking use included cigarettes. He quit after 5.00 years of use. He has never used smokeless tobacco. He reports previous alcohol use. He reports that he does not use drugs.  ROS: UROLOGY Frequent Urination?: No Hard to postpone urination?: No Burning/pain with urination?: No Get up at night to urinate?: No Leakage of urine?: No Urine stream starts and stops?: No Trouble starting stream?: No Do you have to strain to urinate?: No Blood in urine?: No Urinary tract infection?: No Sexually transmitted disease?: No Injury to kidneys or bladder?: No Painful intercourse?: No Weak stream?: No Erection problems?: No Penile pain?: No  Gastrointestinal Nausea?: No Vomiting?: No Indigestion/heartburn?: No Diarrhea?: No Constipation?: No  Constitutional Fever: No Night sweats?: No Weight loss?: No Fatigue?: No  Skin Skin rash/lesions?: No Itching?: No  Eyes Blurred vision?: No Double vision?: No  Ears/Nose/Throat Sore throat?: No Sinus problems?: No  Hematologic/Lymphatic Swollen glands?: No Easy bruising?: No  Cardiovascular Leg swelling?: No Chest pain?: No  Respiratory Cough?: No Shortness of breath?: No  Endocrine Excessive thirst?: No  Musculoskeletal Back pain?: No Joint pain?: No  Neurological Headaches?: No Dizziness?: No  Psychologic Depression?: No Anxiety?: No  Physical Exam: BP 124/68   Pulse 76   Ht 5\' 8"  (1.727 m)   Wt 175 lb (79.4 kg)   BMI 26.61 kg/m   Constitutional:  Alert and oriented, No acute distress. HEENT: [place] AT, moist mucus membranes.  Trachea midline, no masses. Cardiovascular: No clubbing, cyanosis, or edema. Respiratory: Normal respiratory effort, no increased work of breathing. Skin:  No rashes, bruises or suspicious lesions. Neurologic: Grossly intact, no focal deficits, moving all 4 extremities. Psychiatric: Normal mood and affect.   Assessment & Plan:    - Hypogonadism He desires to switch back to testosterone cypionate.  Rx sent to pharmacy.  We will have him follow-up in 2 months for lab visit with testosterone, PSA and hematocrit.   Return in about 6 months (around 08/15/2019) for Recheck.  Abbie Sons, [place] [address], [place],  [address] [phone]

## 2019-02-16 ENCOUNTER — Ambulatory Visit: Payer: 59 | Admitting: Urology

## 2019-02-16 ENCOUNTER — Other Ambulatory Visit: Payer: Self-pay

## 2019-02-16 DIAGNOSIS — N5201 Erectile dysfunction due to arterial insufficiency: Secondary | ICD-10-CM

## 2019-02-16 MED ORDER — SILDENAFIL CITRATE 100 MG PO TABS: ORAL_TABLET | ORAL | 6 refills | Status: DC

## 2019-03-01 ENCOUNTER — Telehealth: Payer: Self-pay | Admitting: Urology

## 2019-03-01 DIAGNOSIS — N5201 Erectile dysfunction due to arterial insufficiency: Secondary | ICD-10-CM

## 2019-03-01 NOTE — Telephone Encounter (Signed)
Pt would like to have a 30 day supply of Sildenafil sent to Jared Wright so he can use his Good RX.

## 2019-03-02 MED ORDER — SILDENAFIL CITRATE 100 MG PO TABS: ORAL_TABLET | ORAL | 6 refills | Status: DC

## 2019-03-02 NOTE — Telephone Encounter (Signed)
RX for sildenafil initially sent to [facility], RX should go to [facility], script sent to correct pharmacy

## 2019-04-14 ENCOUNTER — Other Ambulatory Visit: Payer: Self-pay | Admitting: Family Medicine

## 2019-04-14 DIAGNOSIS — E291 Testicular hypofunction: Secondary | ICD-10-CM

## 2019-04-17 ENCOUNTER — Other Ambulatory Visit: Payer: 59

## 2019-04-17 ENCOUNTER — Encounter: Payer: Self-pay | Admitting: Urology

## 2019-04-19 ENCOUNTER — Emergency Department: Payer: 59

## 2019-04-19 ENCOUNTER — Emergency Department
Admission: EM | Admit: 2019-04-19 | Discharge: 2019-04-19 | Disposition: A | Discharge status: Home / Self Care | Payer: 59 | Attending: Emergency Medicine | Admitting: Emergency Medicine

## 2019-04-19 ENCOUNTER — Other Ambulatory Visit: Payer: Self-pay

## 2019-04-19 DIAGNOSIS — R569 Unspecified convulsions: Secondary | ICD-10-CM | POA: Insufficient documentation

## 2019-04-19 DIAGNOSIS — M792 Neuralgia and neuritis, unspecified: Secondary | ICD-10-CM | POA: Diagnosis not present

## 2019-04-19 DIAGNOSIS — G500 Trigeminal neuralgia: Secondary | ICD-10-CM

## 2019-04-19 DIAGNOSIS — R279 Unspecified lack of coordination: Secondary | ICD-10-CM | POA: Insufficient documentation

## 2019-04-19 DIAGNOSIS — R200 Anesthesia of skin: Secondary | ICD-10-CM | POA: Diagnosis present

## 2019-04-19 LAB — DIFFERENTIAL
Abs Immature Granulocytes: 0.02 10*3/uL (ref 0.00–0.07)
Basophils Absolute: 0.1 10*3/uL (ref 0.0–0.1)
Basophils Relative: 1 %
Eosinophils Absolute: 0.2 10*3/uL (ref 0.0–0.5)
Eosinophils Relative: 3 %
Immature Granulocytes: 0 %
Lymphocytes Relative: 31 %
Lymphs Abs: 2.2 10*3/uL (ref 0.7–4.0)
Monocytes Absolute: 1.1 10*3/uL — ABNORMAL HIGH (ref 0.1–1.0)
Monocytes Relative: 15 %
Neutro Abs: 3.6 10*3/uL (ref 1.7–7.7)
Neutrophils Relative %: 50 %

## 2019-04-19 LAB — COMPREHENSIVE METABOLIC PANEL
ALT: 62 U/L — ABNORMAL HIGH (ref 0–44)
AST: 40 U/L (ref 15–41)
Albumin: 4 g/dL (ref 3.5–5.0)
Alkaline Phosphatase: 45 U/L (ref 38–126)
Anion gap: 11 (ref 5–15)
BUN: 12 mg/dL (ref 6–20)
CO2: 24 mmol/L (ref 22–32)
Calcium: 8.9 mg/dL (ref 8.9–10.3)
Chloride: 104 mmol/L (ref 98–111)
Creatinine, Ser: 1.04 mg/dL (ref 0.61–1.24)
GFR calc Af Amer: >60 mL/min (ref >60)
GFR calc non Af Amer: >60 mL/min (ref >60)
Glucose, Bld: 87 mg/dL (ref 70–99)
Potassium: 4.1 mmol/L (ref 3.5–5.1)
Sodium: 139 mmol/L (ref 135–145)
Total Bilirubin: 0.7 mg/dL (ref 0.3–1.2)
Total Protein: 7.2 g/dL (ref 6.5–8.1)

## 2019-04-19 LAB — CBC
HCT: 49.6 % (ref 39.0–52.0)
Hemoglobin: 16.3 g/dL (ref 13.0–17.0)
MCH: 30 pg (ref 26.0–34.0)
MCHC: 32.9 g/dL (ref 30.0–36.0)
MCV: 91.3 fL (ref 80.0–100.0)
Platelets: 292 10*3/uL (ref 150–400)
RBC: 5.43 MIL/uL (ref 4.22–5.81)
RDW: 14.9 % (ref 11.5–15.5)
WBC: 7.2 10*3/uL (ref 4.0–10.5)
nRBC: 0 % (ref 0.0–0.2)

## 2019-04-19 LAB — PROTIME-INR
INR: 1 (ref 0.8–1.2)
Prothrombin Time: 12.8 seconds (ref 11.4–15.2)

## 2019-04-19 LAB — APTT: aPTT: 26 seconds (ref 24–36)

## 2019-04-19 MED ORDER — GABAPENTIN 300 MG PO CAPS: 300.0000 mg | ORAL_CAPSULE | Freq: Two times a day (BID) | ORAL | 2 refills | Status: DC | PRN

## 2019-04-19 MED ORDER — SODIUM CHLORIDE 0.9% FLUSH: 3.0000 mL | Freq: Once | INTRAVENOUS | Status: DC

## 2019-04-19 NOTE — ED Notes (Signed)
Spoke with MRI regarding patient's concern about needing anxiolytics for MRI. When speaking with pt about this pt reports he developed anxiety "out of the blue in Sept" however he reports he has had multiple MRIs in the past without difficulty. Pt asks if exam will be delayed if he needs oral medications and this RN advised that we would have to wait 30mins-1 hour for oral anxiolytic to take effect before pt could go to MRI. Pt reports he would like to "try it" without medication at this time, MRI informed

## 2019-04-19 NOTE — ED Notes (Signed)
Pt speaking with this RN in NAD, A&Ox4, reports numbness on the top of his head from his brow to the back of his head on the left side that has gradually worsened since Saturday. Pt reports he broke his neck at age 54 and is unsure if this is related. Speech clear.

## 2019-04-19 NOTE — Discharge Instructions (Addendum)
Please seek medical attention for any high fevers, chest pain, shortness of breath, change in behavior, persistent vomiting, bloody stool or any other new or concerning symptoms.  

## 2019-04-19 NOTE — ED Provider Notes (Signed)
[facility] Emergency Department Provider Note  ____________________________________________   I have reviewed the triage vital signs and the nursing notes.   HISTORY  Chief Complaint Numbness   History limited by: Not Limited   HPI THEORY IMIG is a 54 y.o. male who presents to the emergency department today with concern for numbness to the left forehead and left scalp. Numbness started 4 days ago. He has noticed some discomfort as well to his eyebrow on the left side. Additionally for the past couple of weeks the patient has felt that his coordination with his hands has been off. He has noticed he has dropped things like his car keys. He also had one episode when he fell walking out of a door. The patient denies any recent head trauma. Denies any recent illness.     Records reviewed. Per medical record review patient has a history of tremor, anxiety.   Past Medical History:  Diagnosis Date  . Acid reflux   . History of kidney stones   . Renal disorder     Patient Active Problem List   Diagnosis Date Noted  . Seizure ([place]) 01/28/2016  . Difficulty sleeping 12/19/2015  . Tremor, unspecified 12/19/2015  . Anxiety 08/14/2013  . GERD (gastroesophageal reflux disease) 08/14/2013  . Hypogonadism in male 08/14/2013    Past Surgical History:  Procedure Laterality Date  . ESOPHAGEAL DILATION    . KNEE ARTHROSCOPY      Prior to Admission medications   Medication Sig Start Date End Date Taking? Authorizing Provider  B-D 3CC LUER-LOK SYR 23GX1" 23G X 1" 3 ML MISC U UTD 07/24/18   [provider]  doxycycline (VIBRA-TABS) 100 MG tablet TK 1 T PO BID 06/23/18   [provider]  ibuprofen (ADVIL,MOTRIN) 800 MG tablet Take 800 mg by mouth every 8 (eight) hours as needed.    [provider]  Multiple Vitamin (MULTI-VITAMIN) tablet Take by mouth.    [provider]  mupirocin ointment (BACTROBAN) 2 % APPLY TO THE  AFFECTED AREA BID 06/23/18   [provider]  NEEDLE, DISP, 18 G (BD SAFETYGLIDE NEEDLE) 18G X 1-1/2" MISC Use to draw up medication 02/14/19   Abbie Sons, MD  sildenafil (VIAGRA) 100 MG tablet 1 tablet by mouth 1 hour prior to intercourse 03/02/19   Stoioff, Ronda Fairly, MD  SYRINGE-NEEDLE, DISP, 3 ML (LUER LOCK SAFETY SYRINGES) 21G X 1-1/2" 3 ML MISC Use as directed for testosterone administration 02/14/19   Stoioff, Ronda Fairly, MD  testosterone cypionate (DEPOTESTOSTERONE CYPIONATE) 200 MG/ML injection Inject 0.5 mLs (100 mg total) into the muscle once a week. 02/14/19   Abbie Sons, MD    Allergies Ciprofloxacin, Sulfur, and Sulfamethoxazole-trimethoprim  No family history on file.  Social History Social History   Tobacco Use  . Smoking status: Former Smoker    Years: 5.00    Types: Cigarettes  . Smokeless tobacco: Never Used  Substance Use Topics  . Alcohol use: Not Currently  . Drug use: Never    Review of Systems Constitutional: No fever/chills Eyes: No visual changes. ENT: No sore throat. Cardiovascular: Denies chest pain. Respiratory: Denies shortness of breath. Gastrointestinal: No abdominal pain.  No nausea, no vomiting.  No diarrhea.   Genitourinary: Negative for dysuria. Musculoskeletal: Negative for back pain. Skin: Negative for rash. Neurological: Positive for discoordination ____________________________________________   PHYSICAL EXAM:  VITAL SIGNS: ED Triage Vitals  Enc Vitals Group     BP 04/19/19 1258 Marland Kitchen)  162/80     Pulse Rate 04/19/19 1258 84     Resp 04/19/19 1258 18     Temp 04/19/19 1259 98.7 F (37.1 C)     Temp Source 04/19/19 1258 Oral     SpO2 04/19/19 1258 97 %     Weight 04/19/19 1258 180 lb (81.6 kg)     Height 04/19/19 1258 5\' 8"  (1.727 m)     Head Circumference --      Peak Flow --      Pain Score 04/19/19 1258 0   Constitutional: Alert and oriented.  Eyes: Conjunctivae are normal.  ENT      Head: Normocephalic and  atraumatic.      Nose: No congestion/rhinnorhea.      Mouth/Throat: Mucous membranes are moist.      Neck: No stridor. Hematological/Lymphatic/Immunilogical: No cervical lymphadenopathy. Cardiovascular: Normal rate, regular rhythm.  No murmurs, rubs, or gallops.  Respiratory: Normal respiratory effort without tachypnea nor retractions. Breath sounds are clear and equal bilaterally. No wheezes/rales/rhonchi. Gastrointestinal: Soft and non tender. No rebound. No guarding.  Genitourinary: Deferred Musculoskeletal: Normal range of motion in all extremities. No lower extremity edema. Neurologic:  Normal speech and language. Face symmetric. PERRL. EOMI. No pronator drift. Finger to nose normal. Sensation grossly intact. Strength 5/5.  Skin:  Skin is warm, dry and intact. No rash noted. Psychiatric: Mood and affect are normal. Speech and behavior are normal. Patient exhibits appropriate insight and judgment.  ____________________________________________    LABS (pertinent positives/negatives)  CMP wnl except glu alt 62 CBC wbc 7.2, hgb 16.3, plt 292  ____________________________________________   EKG  I, Nance Pear, attending physician, personally viewed and interpreted this EKG  EKG Time: 1308 Rate: 88 Rhythm: normal sinus rhythm Axis: right superior axis deviation Intervals: qtc 387 QRS: narrow, q waves II, aVF ST changes: no st elevation Impression: abnormal ekg  ____________________________________________    RADIOLOGY  CT head No acute abnormality  ____________________________________________   PROCEDURES  Procedures  ____________________________________________   INITIAL IMPRESSION / ASSESSMENT AND PLAN / ED COURSE  Pertinent labs & imaging results that were available during my care of the patient were reviewed by me and considered in my medical decision making (see chart for details).   Patient presented to the emergency department today because of  concern for left sided scalp numbness and discoordination. On exam patient without any focal neuro deficit. Upon palpation of the supraorbital ridge however pain was elicited. MRI was obtained given history of discoordination however no acute stroke was identified. At this time do think it is reasonable for patient to be discharged home. Will have patient follow up with neurology.  ____________________________________________   FINAL CLINICAL IMPRESSION(S) / ED DIAGNOSES  Final diagnoses:  Supraorbital neuralgia  Numbness  Discoordination     Note: This dictation was prepared with Dragon dictation. Any transcriptional errors that result from this process are unintentional     Nance Pear, MD 04/19/19 [phone]

## 2019-04-19 NOTE — ED Triage Notes (Signed)
Pt c/o numbness from central brow, top and toward the back of head for the past 3-4 day. Denies HA or vision changes. States he has also had coordination problems, like handling thing and even stumbling when walking sometimes.

## 2019-04-20 ENCOUNTER — Other Ambulatory Visit: Payer: Self-pay | Admitting: Urology

## 2019-04-21 ENCOUNTER — Encounter: Payer: Self-pay | Admitting: Urology

## 2019-07-24 ENCOUNTER — Other Ambulatory Visit: Payer: Self-pay | Admitting: Urology

## 2019-08-07 ENCOUNTER — Other Ambulatory Visit: Payer: Self-pay | Admitting: Physical Medicine & Rehabilitation

## 2019-08-07 DIAGNOSIS — M542 Cervicalgia: Secondary | ICD-10-CM

## 2019-08-07 DIAGNOSIS — M5412 Radiculopathy, cervical region: Secondary | ICD-10-CM | POA: Insufficient documentation

## 2019-08-14 ENCOUNTER — Other Ambulatory Visit: Payer: Self-pay

## 2019-08-14 ENCOUNTER — Other Ambulatory Visit: Payer: 59

## 2019-08-14 DIAGNOSIS — E291 Testicular hypofunction: Secondary | ICD-10-CM

## 2019-08-15 LAB — TESTOSTERONE: Testosterone: 140 ng/dL — ABNORMAL LOW (ref 264–916)

## 2019-08-15 LAB — PSA: Prostate Specific Ag, Serum: 1.3 ng/mL (ref 0.0–4.0)

## 2019-08-15 LAB — HEMATOCRIT: Hematocrit: 50.5 % (ref 37.5–51.0)

## 2019-08-16 ENCOUNTER — Other Ambulatory Visit: Payer: Self-pay

## 2019-08-16 ENCOUNTER — Encounter: Payer: Self-pay | Admitting: Urology

## 2019-08-16 ENCOUNTER — Ambulatory Visit (INDEPENDENT_AMBULATORY_CARE_PROVIDER_SITE_OTHER): Payer: 59 | Admitting: Urology

## 2019-08-16 VITALS — BP 130/74 | HR 76 | Ht 67.0 in | Wt 177.9 lb

## 2019-08-16 DIAGNOSIS — F5232 Male orgasmic disorder: Secondary | ICD-10-CM

## 2019-08-16 DIAGNOSIS — E291 Testicular hypofunction: Secondary | ICD-10-CM | POA: Diagnosis not present

## 2019-08-16 NOTE — Progress Notes (Signed)
08/16/19 9:08 AM   Jared Wright Aug 01, 1965 [id number]  Referring provider: Sofie Hartigan, [place],  [place] [address] No chief complaint on file.   HPI: Jared Wright is a 54 y.o. male who presents today for a semiannual follow up.  -Prior history of hypogonadism  -Remains on testosterone supinate, reports that he prefers it over clomid as he notes increased energy levels and libido.  -Testosterone is 140, PSA is 1.3, Hematocrit 50.5 on 08/14/19 -Last injection was more than 2 weeks ago, prior to these labs as he had run out of the medication -Only other complaint is delayed ejaculation, he on no medication that would cause this side effect.   PMH: Past Medical History:  Diagnosis Date  . Acid reflux   . History of kidney stones   . Renal disorder     Surgical History: Past Surgical History:  Procedure Laterality Date  . ESOPHAGEAL DILATION    . KNEE ARTHROSCOPY      Home Medications:  Allergies as of 08/16/2019      Reactions   Ciprofloxacin    Other reaction(s): Unknown   Sulfur    Sulfamethoxazole-trimethoprim Palpitations   Also noted flu like aches      Medication List       Accurate as of August 16, 2019  9:08 AM. If you have any questions, ask your nurse or doctor.        B-D 3CC LUER-LOK SYR 23GX1" 23G X 1" 3 ML Misc Generic drug: SYRINGE-NEEDLE (DISP) 3 ML U UTD   Luer Lock Safety Syringes 21G X 1-1/2" 3 ML Misc Generic drug: SYRINGE-NEEDLE (DISP) 3 ML Use as directed for testosterone administration   BD SafetyGlide Needle 18G X 1-1/2" Misc Generic drug: NEEDLE (DISP) 18 G Use to draw up medication   doxycycline 100 MG tablet Commonly known as: VIBRA-TABS TK 1 T PO BID   gabapentin 300 MG capsule Commonly known as: Neurontin Take 1 capsule (300 mg total) by mouth 2 (two) times daily as needed (pain).   ibuprofen 800 MG tablet Commonly known as: ADVIL Take 800 mg by mouth every 8 (eight) hours as  needed.   Multi-Vitamin tablet Take by mouth.   mupirocin ointment 2 % Commonly known as: BACTROBAN APPLY TO THE AFFECTED AREA BID   sildenafil 100 MG tablet Commonly known as: VIAGRA 1 tablet by mouth 1 hour prior to intercourse   testosterone cypionate 200 MG/ML injection Commonly known as: DEPOTESTOSTERONE CYPIONATE INJECT 0.5 ML (100 MG TOTAL) INTO THE MUSCLE ONCE A WEEK       Allergies:  Allergies  Allergen Reactions  . Ciprofloxacin     Other reaction(s): Unknown  . Sulfur   . Sulfamethoxazole-Trimethoprim Palpitations    Also noted flu like aches    Family History: No family history on file.  Social History:  reports that he has quit smoking. His smoking use included cigarettes. He quit after 5.00 years of use. He has never used smokeless tobacco. He reports previous alcohol use. He reports that he does not use drugs.   Physical Exam: There were no vitals taken for this visit.  Constitutional:  Alert and oriented, No acute distress. HEENT: [place] AT, moist mucus membranes.  Trachea midline, no masses. Cardiovascular: No clubbing, cyanosis, or edema. Respiratory: Normal respiratory effort, no increased work of breathing. GU: No CVA tenderness. Prostate is 35 grams, smooth without nodules. Skin: No rashes, bruises or suspicious lesions. Neurologic: Grossly intact, no  focal deficits, moving all 4 extremities. Psychiatric: Normal mood and affect.  Laboratory Data:  Lab Results  Component Value Date   CREATININE 1.04 04/19/2019    Lab Results  Component Value Date   TESTOSTERONE 140 (L) 08/14/2019    Assessment & Plan:   1. Hypogonadism - Symptoms improved on testosterone injections -PSA slightly elevated above baseline at 1.3  -Schedule 6 month lab visit for PSA, testosterone and hematocrit -Schedule 1 year follow up-Sent refill for testosterone and needles to pharmacy -Testosterone was refilled  2.  Delayed ejaculation -New problem -On no  medications which would cause this side effect -Discussed this is a difficult problem to treat  [facility] [place] [address] [place], [place] [address] [phone]  I, Joneen Boers Peace, am acting as a Education administrator for Dr. Nicki Reaper C. Ariyon Mittleman.  I have reviewed the above documentation for accuracy and completeness, and I agree with the above.   Abbie Sons, MD

## 2019-08-17 ENCOUNTER — Encounter: Payer: Self-pay | Admitting: Urology

## 2019-08-17 MED ORDER — "LUER LOCK SAFETY SYRINGES 21G X 1-1/2"" 3 ML MISC": 0 refills | Status: DC

## 2019-08-17 MED ORDER — TESTOSTERONE CYPIONATE 200 MG/ML IM SOLN: 140.0000 mg | INTRAMUSCULAR | 0 refills | Status: DC

## 2019-08-17 MED ORDER — "BD SAFETYGLIDE NEEDLE 18G X 1-1/2"" MISC": 0 refills | Status: DC

## 2019-08-22 ENCOUNTER — Other Ambulatory Visit: Payer: Self-pay

## 2019-08-22 ENCOUNTER — Ambulatory Visit
Admission: RE | Admit: 2019-08-22 | Discharge: 2019-08-22 | Disposition: A | Discharge status: Home / Self Care | Payer: 59 | Source: Ambulatory Visit | Attending: Physical Medicine & Rehabilitation | Admitting: Physical Medicine & Rehabilitation

## 2019-08-22 DIAGNOSIS — M542 Cervicalgia: Secondary | ICD-10-CM | POA: Insufficient documentation

## 2019-08-28 DIAGNOSIS — M4802 Spinal stenosis, cervical region: Secondary | ICD-10-CM | POA: Insufficient documentation

## 2019-10-31 ENCOUNTER — Other Ambulatory Visit: Payer: Self-pay | Admitting: Urology

## 2020-01-26 ENCOUNTER — Other Ambulatory Visit: Payer: Self-pay | Admitting: Urology

## 2020-02-12 ENCOUNTER — Other Ambulatory Visit: Payer: Self-pay | Admitting: Family Medicine

## 2020-02-12 DIAGNOSIS — E291 Testicular hypofunction: Secondary | ICD-10-CM

## 2020-02-13 ENCOUNTER — Telehealth: Payer: Self-pay | Admitting: *Deleted

## 2020-02-13 NOTE — Telephone Encounter (Signed)
Patient with patient today and we have not any infor on his testosterone for PA. I look in cover my meds . Patient is going to talk with his pharmacy,

## 2020-02-13 NOTE — Telephone Encounter (Signed)
Patient called Triage line, out of testosterone for 3 weeks. He has been waiting on a PA. Please advise.

## 2020-02-15 ENCOUNTER — Other Ambulatory Visit: Payer: Self-pay

## 2020-02-15 NOTE — Telephone Encounter (Signed)
Patient left vm stating insurance states we did not fill out clinicals properly.

## 2020-02-19 NOTE — Telephone Encounter (Signed)
Incoming message left on triage voicemail on 12/31 in regards to patients testosterone RX. Patient states the wrong clinical information was sent. Patient request a phone call back so he can get Rx filled.

## 2020-02-22 NOTE — Telephone Encounter (Signed)
Notified patient as instructed, patient pleased. Discussed follow-up appointments, patient agrees  

## 2020-02-22 NOTE — Telephone Encounter (Signed)
Received DENIAL from [facility] for testosterone medication.  They will only cover medication with 2 low testosterone early morning labs. We do not have 2 low labs because patient has been on continued therapy for a long time.  Patient has been without medication for several weeks.  Scheduled a lab appointment for 1/7 before 9:30am to have labs drawn.  Once we have a second low lab, we can resubmit to [facility] for approval.  Left detailed message on the patient's voice mail with this information.

## 2020-02-23 ENCOUNTER — Other Ambulatory Visit: Payer: BC Managed Care – PPO

## 2020-02-23 ENCOUNTER — Other Ambulatory Visit: Payer: Self-pay

## 2020-02-23 DIAGNOSIS — E291 Testicular hypofunction: Secondary | ICD-10-CM

## 2020-02-24 LAB — PSA: Prostate Specific Ag, Serum: 0.9 ng/mL (ref 0.0–4.0)

## 2020-02-24 LAB — HEMATOCRIT: Hematocrit: 54.4 % — ABNORMAL HIGH (ref 37.5–51.0)

## 2020-02-24 LAB — TESTOSTERONE: Testosterone: 471 ng/dL (ref 264–916)

## 2020-02-25 ENCOUNTER — Telehealth: Payer: Self-pay | Admitting: Urology

## 2020-02-25 NOTE — Telephone Encounter (Signed)
Testosterone level was 471.  Is he back on testosterone replacement as last notes indicate insurance would not cover unless he had low levels.

## 2020-02-26 NOTE — Telephone Encounter (Signed)
Patient's lab results were provided to him over the phone.  He took his last testosterone injection the first week in December.  He has not had any injections since then.  Patient did have a few Clomid tablets (about 2 weeks worth) that he took after his testosterone ran out.    Please advise.

## 2020-02-26 NOTE — Telephone Encounter (Signed)
LMOM for patient to return call.

## 2020-02-27 ENCOUNTER — Encounter: Payer: Self-pay | Admitting: Urology

## 2020-02-27 NOTE — Telephone Encounter (Signed)
Recommend repeating testosterone level in 1 month-lab visit

## 2020-02-29 ENCOUNTER — Other Ambulatory Visit: Payer: Self-pay | Admitting: Family Medicine

## 2020-02-29 ENCOUNTER — Other Ambulatory Visit: Payer: Self-pay | Admitting: Urology

## 2020-02-29 DIAGNOSIS — N5201 Erectile dysfunction due to arterial insufficiency: Secondary | ICD-10-CM

## 2020-03-01 ENCOUNTER — Other Ambulatory Visit: Payer: Self-pay | Admitting: Family Medicine

## 2020-03-01 ENCOUNTER — Other Ambulatory Visit: Payer: Self-pay | Admitting: Urology

## 2020-03-01 DIAGNOSIS — N5201 Erectile dysfunction due to arterial insufficiency: Secondary | ICD-10-CM

## 2020-03-01 MED ORDER — CLOMIPHENE CITRATE 50 MG PO TABS: ORAL_TABLET | ORAL | 1 refills | Status: DC

## 2020-03-01 NOTE — Telephone Encounter (Signed)
Spoke to patient and Clomid and Sildenafil were sent to [facility]. Patient will use the Goodrx app for the medications.

## 2020-03-14 ENCOUNTER — Other Ambulatory Visit: Payer: Self-pay

## 2020-06-28 ENCOUNTER — Telehealth: Payer: Self-pay | Admitting: Urology

## 2020-06-28 ENCOUNTER — Other Ambulatory Visit: Payer: Self-pay | Admitting: Family Medicine

## 2020-06-28 DIAGNOSIS — E291 Testicular hypofunction: Secondary | ICD-10-CM

## 2020-06-28 NOTE — Telephone Encounter (Signed)
Pt is out of meds, he only has 1 for tomorrow (Clomiehene 50 mg)  He is going out of town before next appt on 6/29.  [facility] uses [facility] in [place].

## 2020-06-28 NOTE — Telephone Encounter (Signed)
Spoke to patient and he was suppose to get labs drawn after taking 1 month of the Clomid. I have scheduled a lab appointment and informed him that he can get refills after the blood work has been reviewed by Dr. Bernardo Heater. He stated well my numbers are going to be way down. I informed him that if his numbers are low he will need to take the medication for 1 month and possibly need blood work done again.

## 2020-07-03 ENCOUNTER — Other Ambulatory Visit: Payer: Self-pay | Admitting: Family Medicine

## 2020-07-09 ENCOUNTER — Other Ambulatory Visit: Payer: Self-pay

## 2020-07-09 ENCOUNTER — Other Ambulatory Visit: Payer: BC Managed Care – PPO

## 2020-07-09 DIAGNOSIS — E291 Testicular hypofunction: Secondary | ICD-10-CM

## 2020-07-10 LAB — TESTOSTERONE: Testosterone: 927 ng/dL — ABNORMAL HIGH (ref 264–916)

## 2020-07-10 LAB — HEMOGLOBIN: Hemoglobin: 17.3 g/dL (ref 13.0–17.7)

## 2020-07-10 LAB — HEMATOCRIT: Hematocrit: 50.8 % (ref 37.5–51.0)

## 2020-07-12 ENCOUNTER — Other Ambulatory Visit: Payer: Self-pay | Admitting: *Deleted

## 2020-07-15 MED ORDER — CLOMIPHENE CITRATE 50 MG PO TABS: ORAL_TABLET | ORAL | 1 refills | Status: DC

## 2020-08-14 ENCOUNTER — Ambulatory Visit: Payer: Self-pay | Admitting: Urology

## 2020-08-15 ENCOUNTER — Ambulatory Visit: Payer: Self-pay | Admitting: Urology

## 2020-08-23 ENCOUNTER — Encounter: Payer: Self-pay | Admitting: Urology

## 2020-08-23 ENCOUNTER — Ambulatory Visit: Payer: BC Managed Care – PPO | Admitting: Urology

## 2020-08-23 ENCOUNTER — Other Ambulatory Visit: Payer: Self-pay

## 2020-08-23 VITALS — BP 164/91 | HR 78 | Ht 68.0 in | Wt 190.0 lb

## 2020-08-23 DIAGNOSIS — E291 Testicular hypofunction: Secondary | ICD-10-CM

## 2020-08-23 DIAGNOSIS — N5201 Erectile dysfunction due to arterial insufficiency: Secondary | ICD-10-CM

## 2020-08-23 NOTE — Progress Notes (Signed)
08/23/2020 11:58 AM   Jared Wright [birth year] [id number]  Referring provider: Sofie Hartigan, MD [place],  [place] [address]  Chief Complaint  Patient presents with   Hypogonadism    Urologic history: 1.  Hypogonadism Clomid 25 mg daily Symptoms tiredness, fatigue, decreased libido  2.  Erectile dysfunction Sildenafil 100 mg   HPI: 55 y.o. male presents for annual follow-up.  Since last years visit he states his insurance company no longer covered testosterone injections and he went back to Clomid Notes good energy level and libido No bothersome LUTS Labs 07/09/2020: Testosterone 927 ng/dL; HCT 50.8 PSA 02/2020 stable 0.9 Remains on sildenafil for ED   PMH: Past Medical History:  Diagnosis Date   Acid reflux    History of kidney stones    Renal disorder     Surgical History: Past Surgical History:  Procedure Laterality Date   ESOPHAGEAL DILATION     KNEE ARTHROSCOPY      Home Medications:  Allergies as of 08/23/2020       Reactions   Ciprofloxacin    Other reaction(s): Unknown   Elemental Sulfur    Sulfamethoxazole-trimethoprim Palpitations   Also noted flu like aches        Medication List        Accurate as of August 23, 2020 11:59 PM. If you have any questions, ask your nurse or doctor.          STOP taking these medications    BD SafetyGlide Needle 18G X 1-1/2" Misc Generic drug: NEEDLE (DISP) 18 G Stopped by: Abbie Sons, MD   doxycycline 100 MG tablet Commonly known as: VIBRA-TABS Stopped by: Abbie Sons, MD   Luer Lock Safety Syringes 21G X 1-1/2" 3 ML Misc Generic drug: SYRINGE-NEEDLE (DISP) 3 ML Stopped by: Abbie Sons, MD   testosterone cypionate 200 MG/ML injection Commonly known as: DEPOTESTOSTERONE CYPIONATE Stopped by: Abbie Sons, MD       TAKE these medications    clomiPHENE 50 MG tablet Commonly known as: CLOMID Take 1/2 tablet daily   ibuprofen 800 MG  tablet Commonly known as: ADVIL Take 800 mg by mouth every 8 (eight) hours as needed.   mupirocin ointment 2 % Commonly known as: BACTROBAN APPLY TO THE AFFECTED AREA BID   sildenafil 100 MG tablet Commonly known as: VIAGRA TAKE ONE TABLET BY MOUTH ONE HOUR PRIOR TO INTERCOURSE        Allergies:  Allergies  Allergen Reactions   Ciprofloxacin     Other reaction(s): Unknown   Elemental Sulfur    Sulfamethoxazole-Trimethoprim Palpitations    Also noted flu like aches    Family History: No family history on file.  Social History:  reports that he has quit smoking. His smoking use included cigarettes. He has never used smokeless tobacco. He reports previous alcohol use. He reports that he does not use drugs.   Physical Exam: BP (!) 164/91   Pulse 78   Ht 5\' 8"  (1.727 m)   Wt 190 lb (86.2 kg)   BMI 28.89 kg/m   Constitutional:  Alert and oriented, No acute distress. HEENT: [place] AT, moist mucus membranes.  Trachea midline, no masses. Cardiovascular: No clubbing, cyanosis, or edema. Respiratory: Normal respiratory effort, no increased work of breathing. Skin: No rashes, bruises or suspicious lesions. Neurologic: Grossly intact, no focal deficits, moving all 4 extremities. Psychiatric: Normal mood and affect.   Assessment & Plan:    1.  Hypogonadism Symptom improvement on Clomid Labs look good Clomid was refilled Lab visit 6 months testosterone/hematocrit/PSA/LFTs Office visit 1 year  2.  Erectile dysfunction Stable on sildenafil Refill sent to [facility], [place] [address], [place], [place] [address] [phone]

## 2020-08-24 MED ORDER — SILDENAFIL CITRATE 100 MG PO TABS
ORAL_TABLET | ORAL | 6 refills | Status: DC
Start: 2020-08-24 — End: 2022-05-05

## 2020-08-24 MED ORDER — CLOMIPHENE CITRATE 50 MG PO TABS: ORAL_TABLET | ORAL | 1 refills | Status: DC

## 2020-11-20 DIAGNOSIS — M19041 Primary osteoarthritis, right hand: Secondary | ICD-10-CM | POA: Insufficient documentation

## 2020-11-20 DIAGNOSIS — M19042 Primary osteoarthritis, left hand: Secondary | ICD-10-CM | POA: Insufficient documentation

## 2021-01-30 ENCOUNTER — Other Ambulatory Visit: Payer: Self-pay

## 2021-01-30 DIAGNOSIS — E291 Testicular hypofunction: Secondary | ICD-10-CM

## 2021-02-02 IMAGING — MR MR CERVICAL SPINE W/O CM
5 series · 35 of 48 positions shown · non-contrast
Comparison: Head MRI 04/19/2019.

CLINICAL DATA: 53-year-old male with remote injury of neck
fracture. Progressive pain left greater than right involving the
bilateral hands. Painful range of motion.

EXAM:
MRI CERVICAL SPINE WITHOUT CONTRAST
TECHNIQUE: Multiplanar, multisequence MR imaging of the cervical spine was
performed. No intravenous contrast was administered.

[Series 5: T2 · sagittal · 3.0mm · 0.62mm/px · 6 of 15 slices shown (1 of 2)]
[im 1/15]
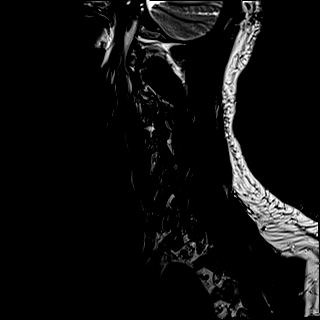
[im 3/15]
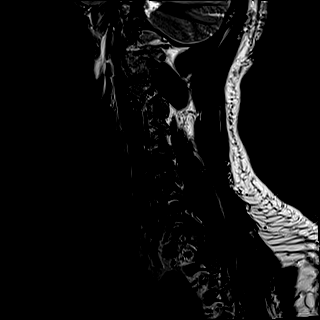
[im 6/15]
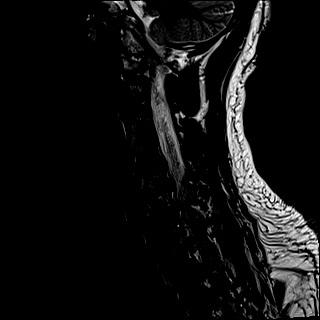
[im 9/15]
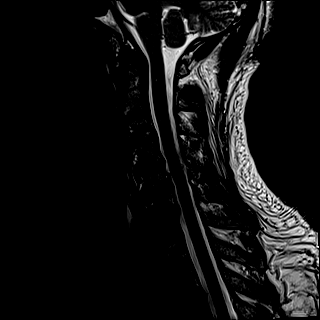
[im 12/15]
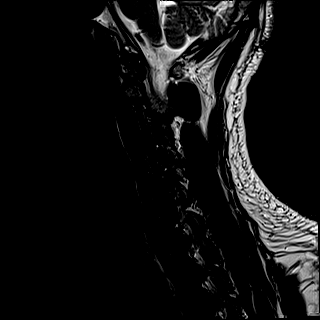
[im 15/15]
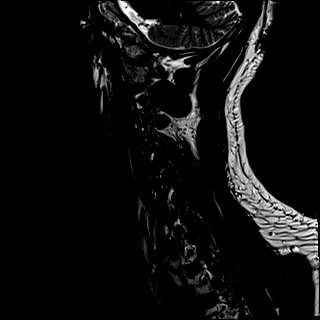

[Series 6: FLAIR · sagittal · 3.0mm · 0.78mm/px · 7 of 15 slices shown]
[im 1/15]
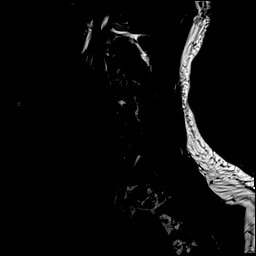
[im 3/15]
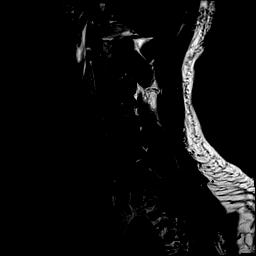
[im 5/15]
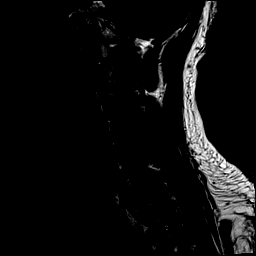
[im 8/15]
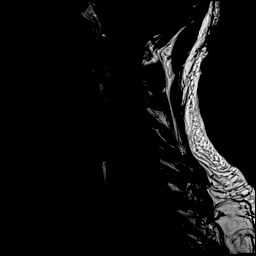
[im 10/15]
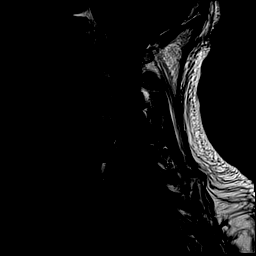
[im 12/15]
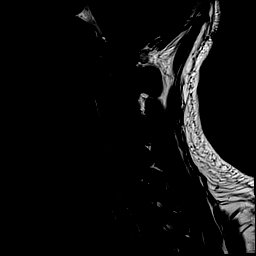
[im 15/15]
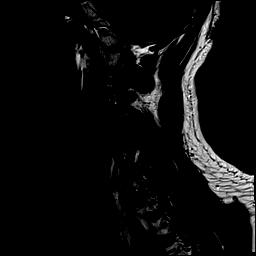

[Series 8: T2 · axial · 3.0mm · 0.70mm/px · z∈[-138,-44]mm · 8 of 29 slices shown (2 of 2)]
[im 1/29]
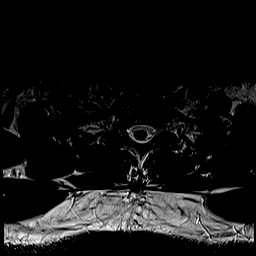
[im 5/29]
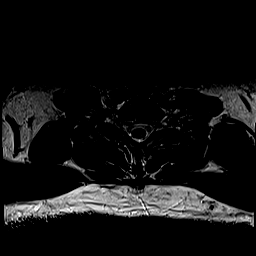
[im 9/29]
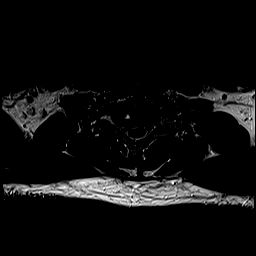
[im 13/29]
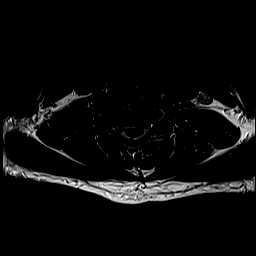
[im 16/29]
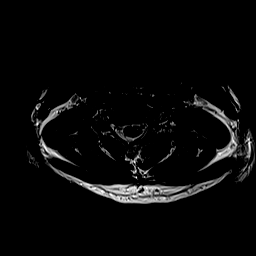
[im 20/29]
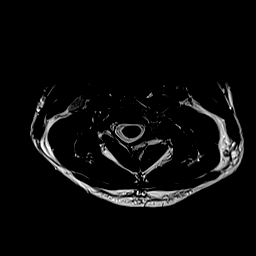
[im 24/29]
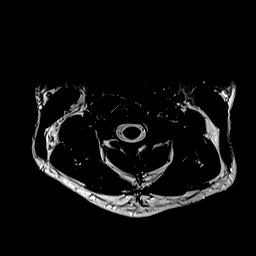
[im 29/29]
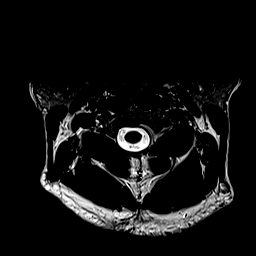

[Series 9: ax mpgr · axial · 3.0mm · 0.35mm/px · z∈[-138,-61]mm · 7 of 29 slices shown]
[im 1/29]
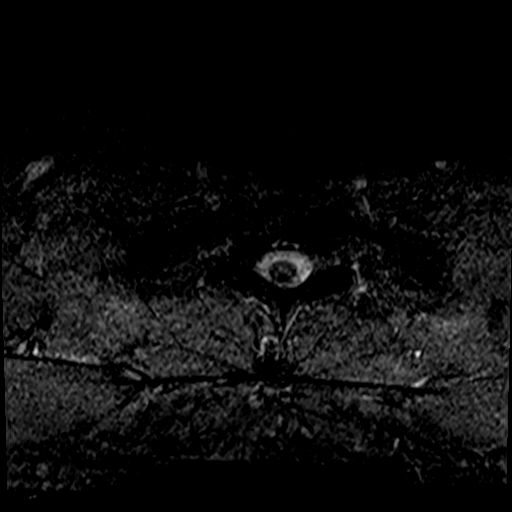
[im 5/29]
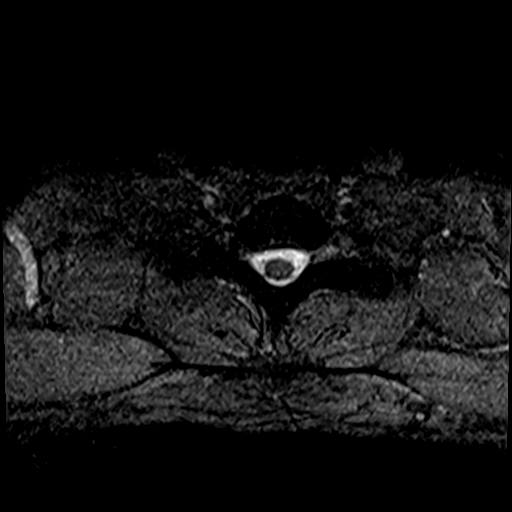
[im 9/29]
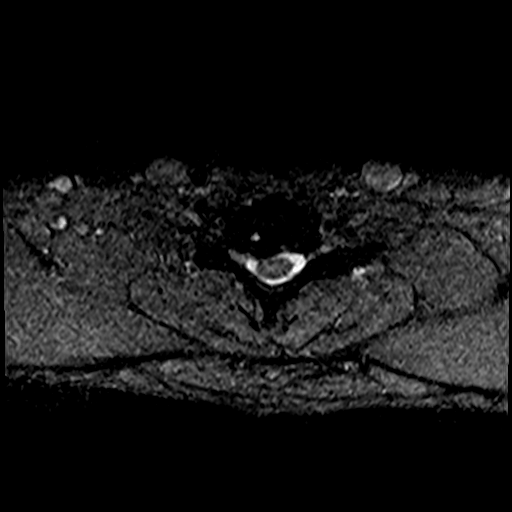
[im 13/29]
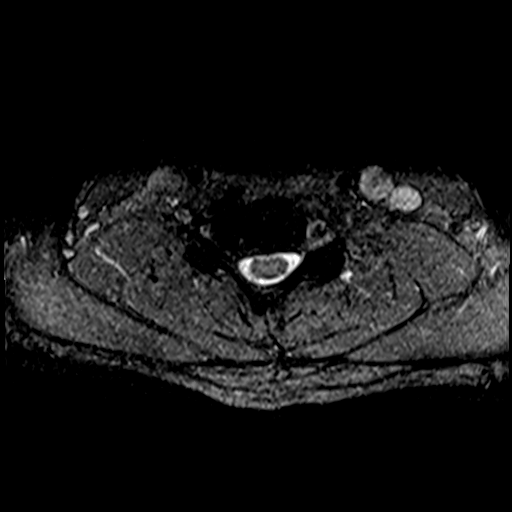
[im 16/29]
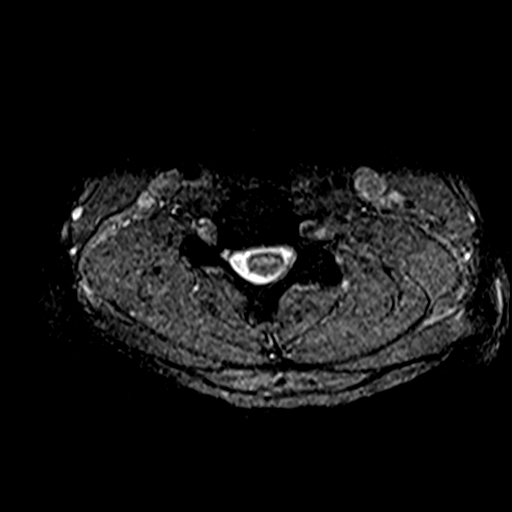
[im 20/29]
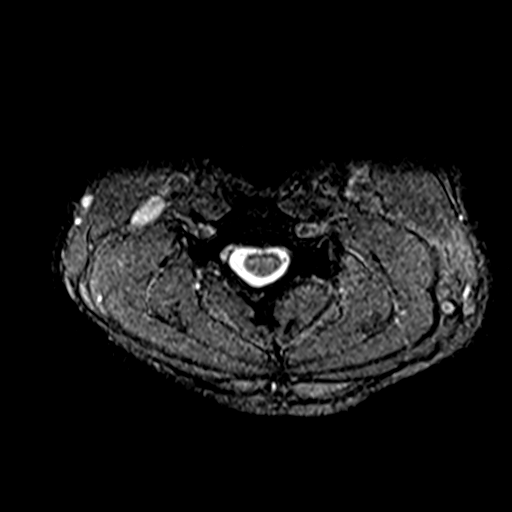
[im 24/29]
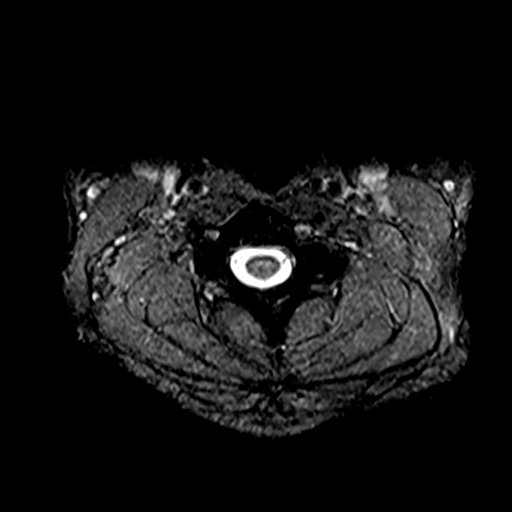

[Series 10: STIR · sagittal · 3.0mm · 0.62mm/px · 7 of 15 slices shown]
[im 1/15]
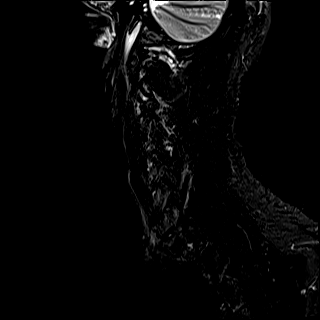
[im 3/15]
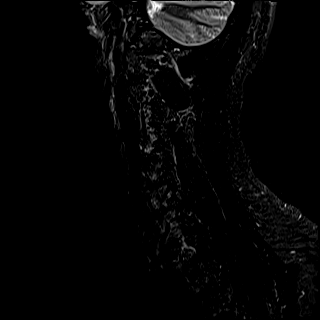
[im 5/15]
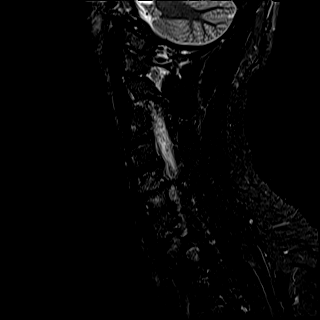
[im 8/15]
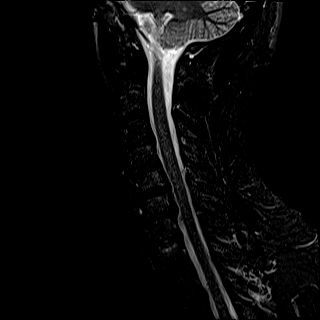
[im 10/15]
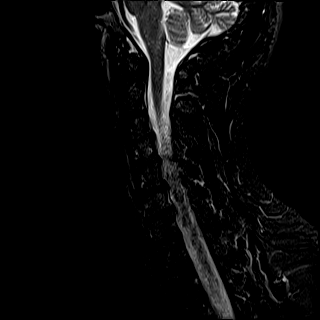
[im 12/15]
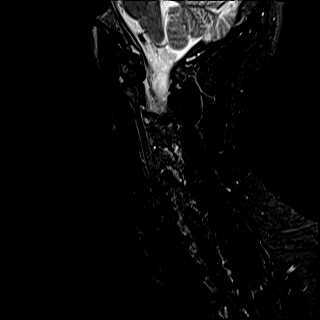
[im 15/15]
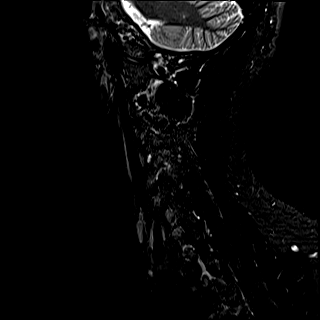

[35 of 48 positions shown; findings below may reference images not displayed]

FINDINGS: Alignment: Straightening of cervical lordosis. No spondylolisthesis.
Mild dextroconvex cervical scoliosis.

Vertebrae: Degenerative appearing endplate marrow edema eccentric to
the right at C5-C6 and C7-T1 (series 10, image 6). Some chronic
degenerative endplate marrow signal changes elsewhere. And on the
left there is confluent marrow edema in a chronically degenerated
C4-C5 facet (series 10, image 13 and series 6, image 13). See
additional details at that level below. Background bone marrow
signal within normal limits.

Cord: No spinal cord signal abnormality despite some degenerative
spinal stenosis with cord mass effect detailed below.

Posterior Fossa, vertebral arteries, paraspinal tissues:
Cervicomedullary junction is within normal limits. Negative visible
posterior fossa. Preserved major vascular flow voids in the neck.
The left vertebral artery appears dominant.

Chronic postoperative changes to the midline neck soft tissues
posteriorly. Otherwise negative visible neck soft tissues. Partially
retropharyngeal course of the left carotid (normal variant).
Negative visible right lung apex.

Disc levels:

C2-C3: Moderate left facet hypertrophy. Moderate left C3 foraminal
stenosis.

C3-C4: Mild circumferential disc bulge and endplate spurring.
Suspicion of left side facet ankylosis at this level. Mild to
moderate left C4 foraminal stenosis.

C4-C5: Right eccentric circumferential disc bulge and endplate
spurring. Moderate left facet hypertrophy with trace degenerative
facet joint fluid and a subchondral degenerative cyst in addition to
the edema detailed above. No spinal stenosis. Severe left and
moderate right C5 foraminal stenosis.

C5-C6: Circumferential disc bulge and endplate spurring eccentric to
the right. Mild ligament flavum hypertrophy. Mild spinal stenosis.
Mild cord mass effect. Moderate left and severe right C6 foraminal
stenosis.

C6-C7: Circumferential disc bulge and endplate spurring eccentric to
the right. Mild spinal stenosis. Mild if any cord mass effect.
Moderate to severe right greater than left C7 foraminal stenosis.

C7-T1: Mild circumferential disc bulge and endplate spurring greater
on the right. Mild posterior element hypertrophy. No spinal
stenosis. Mild right C8 foraminal stenosis.

No upper thoracic spinal stenosis despite mild disc bulging at
T2-T3.
IMPRESSION: 1. Symptomatic level with regard to left side pain suspected to be
C4-C5 4 there is evidence of acute exacerbation of chronic facet
joint arthritis on the left. Probable superimposed ankylosis of the
left facets at the adjacent C3-C4 level. There is also degenerative
appearing endplate marrow edema eccentric to the right at C5-C6 and
C7-T1
2. Subsequent moderate left C4 and moderate to severe left greater
than right C5 foraminal stenosis.
3. Mild multifactorial spinal stenosis with up to mild cord mass
effect at C5-C6 and C6-C7, but no cord signal abnormality.
4. Moderate or severe degenerative neural foraminal stenosis also at
thebilateral C6 and C7 nerve levels.

## 2021-02-24 ENCOUNTER — Other Ambulatory Visit: Payer: Self-pay

## 2021-02-24 ENCOUNTER — Other Ambulatory Visit: Payer: BC Managed Care – PPO

## 2021-02-24 DIAGNOSIS — E291 Testicular hypofunction: Secondary | ICD-10-CM

## 2021-02-25 LAB — HEPATIC FUNCTION PANEL
ALT: 77 IU/L — ABNORMAL HIGH (ref 0–44)
AST: 47 IU/L — ABNORMAL HIGH (ref 0–40)
Albumin: 4.4 g/dL (ref 3.8–4.9)
Alkaline Phosphatase: 70 IU/L (ref 44–121)
Bilirubin Total: 0.6 mg/dL (ref 0.0–1.2)
Bilirubin, Direct: 0.18 mg/dL (ref 0.00–0.40)
Total Protein: 7 g/dL (ref 6.0–8.5)

## 2021-02-25 LAB — PSA: Prostate Specific Ag, Serum: 0.8 ng/mL (ref 0.0–4.0)

## 2021-02-25 LAB — TESTOSTERONE: Testosterone: 870 ng/dL (ref 264–916)

## 2021-02-25 LAB — HEMATOCRIT: Hematocrit: 48.6 % (ref 37.5–51.0)

## 2021-02-28 ENCOUNTER — Encounter: Payer: Self-pay | Admitting: *Deleted

## 2021-03-06 ENCOUNTER — Telehealth: Payer: Self-pay | Admitting: *Deleted

## 2021-03-06 MED ORDER — CLOMIPHENE CITRATE 50 MG PO TABS: ORAL_TABLET | ORAL | 1 refills | Status: DC

## 2021-03-06 NOTE — Telephone Encounter (Signed)
Patient called Triage line would like to proceed with Clomid. He was told at [facility] was out of stock and back order. Called and checked, [facility] can fill prescription for 30 day supply.

## 2021-08-22 ENCOUNTER — Other Ambulatory Visit: Payer: BC Managed Care – PPO

## 2021-08-22 DIAGNOSIS — E291 Testicular hypofunction: Secondary | ICD-10-CM

## 2021-08-23 LAB — TESTOSTERONE: Testosterone: 799 ng/dL (ref 264–916)

## 2021-08-23 LAB — HEMATOCRIT: Hematocrit: 46.5 % (ref 37.5–51.0)

## 2021-08-29 ENCOUNTER — Ambulatory Visit: Payer: BC Managed Care – PPO | Admitting: Urology

## 2021-08-29 ENCOUNTER — Encounter: Payer: Self-pay | Admitting: Urology

## 2021-08-29 VITALS — BP 154/93 | HR 93 | Ht 68.0 in | Wt 195.0 lb

## 2021-08-29 DIAGNOSIS — N5201 Erectile dysfunction due to arterial insufficiency: Secondary | ICD-10-CM | POA: Diagnosis not present

## 2021-08-29 DIAGNOSIS — E291 Testicular hypofunction: Secondary | ICD-10-CM

## 2021-08-29 MED ORDER — CLOMIPHENE CITRATE 50 MG PO TABS
ORAL_TABLET | ORAL | 2 refills | Status: DC
Start: 2021-08-29 — End: 2022-04-05

## 2021-08-29 NOTE — Progress Notes (Signed)
   08/29/2021 11:40 AM   Jared Wright 1965/08/17 [id number]  Referring provider: Sofie Hartigan, MD [place],  [place] [address]  Chief Complaint  Patient presents with   Hypogonadism    Urologic history: 1.  Hypogonadism Clomid 25 mg daily Symptoms tiredness, fatigue, decreased libido  2.  Erectile dysfunction Sildenafil 100 mg   HPI: 56 y.o. male presents for annual follow-up.  Stable on Clomid Notes good energy level and libido No bothersome LUTS Labs 08/22/2021: Testosterone 799 ng/dL; HCT 46.5 PSA 02/2021 stable 0.8 Remains on sildenafil for ED LFTs 03/2021 mildly elevated: AST 46/ALT 83.  He states this was caused by medication he was taking several years ago and is being monitored by Dr. Ellison Hughs   PMH: Past Medical History:  Diagnosis Date   Acid reflux    History of kidney stones    Renal disorder     Surgical History: Past Surgical History:  Procedure Laterality Date   ESOPHAGEAL DILATION     KNEE ARTHROSCOPY      Home Medications:  Allergies as of 08/29/2021       Reactions   Ciprofloxacin    Other reaction(s): Unknown   Elemental Sulfur    Sulfamethoxazole-trimethoprim Palpitations   Also noted flu like aches        Medication List        Accurate as of August 29, 2021 11:40 AM. If you have any questions, ask your nurse or doctor.          clomiPHENE 50 MG tablet Commonly known as: CLOMID Take 1/2 tablet daily   ibuprofen 800 MG tablet Commonly known as: ADVIL Take 800 mg by mouth every 8 (eight) hours as needed.   mupirocin ointment 2 % Commonly known as: BACTROBAN APPLY TO THE AFFECTED AREA BID   sildenafil 100 MG tablet Commonly known as: VIAGRA TAKE ONE TABLET BY MOUTH ONE HOUR PRIOR TO INTERCOURSE        Allergies:  Allergies  Allergen Reactions   Ciprofloxacin     Other reaction(s): Unknown   Elemental Sulfur    Sulfamethoxazole-Trimethoprim Palpitations    Also noted flu like  aches    Family History: No family history on file.  Social History:  reports that he has quit smoking. His smoking use included cigarettes. He has never used smokeless tobacco. He reports that he does not currently use alcohol. He reports that he does not use drugs.   Physical Exam: BP (!) 154/93   Pulse 93   Ht '5\' 8"'$  (1.727 m)   Wt 195 lb (88.5 kg)   BMI 29.65 kg/m   Constitutional:  Alert and oriented, No acute distress. HEENT: [place] AT Respiratory: Normal respiratory effort, no increased work of breathing. Psychiatric: Normal mood and affect.   Assessment & Plan:    1.  Hypogonadism Stable Labs look good Clomid was refilled Lab visit 6 months testosterone/hematocrit Office visit 1 year  2.  Erectile dysfunction Stable on sildenafil    Abbie Sons, MD  [facility] [address], [place], [place] [address] [phone]

## 2021-08-30 ENCOUNTER — Other Ambulatory Visit: Payer: Self-pay | Admitting: Urology

## 2021-11-04 DIAGNOSIS — R55 Syncope and collapse: Secondary | ICD-10-CM | POA: Insufficient documentation

## 2021-11-04 DIAGNOSIS — R0602 Shortness of breath: Secondary | ICD-10-CM | POA: Insufficient documentation

## 2021-11-05 ENCOUNTER — Emergency Department: Payer: BC Managed Care – PPO

## 2021-11-05 ENCOUNTER — Emergency Department
Admission: EM | Admit: 2021-11-05 | Discharge: 2021-11-05 | Disposition: A | Discharge status: Home / Self Care | Payer: BC Managed Care – PPO | Attending: Emergency Medicine | Admitting: Emergency Medicine

## 2021-11-05 ENCOUNTER — Encounter: Payer: Self-pay | Admitting: Emergency Medicine

## 2021-11-05 DIAGNOSIS — R791 Abnormal coagulation profile: Secondary | ICD-10-CM | POA: Insufficient documentation

## 2021-11-05 DIAGNOSIS — R531 Weakness: Secondary | ICD-10-CM | POA: Diagnosis not present

## 2021-11-05 DIAGNOSIS — M47816 Spondylosis without myelopathy or radiculopathy, lumbar region: Secondary | ICD-10-CM

## 2021-11-05 DIAGNOSIS — M21371 Foot drop, right foot: Secondary | ICD-10-CM | POA: Diagnosis not present

## 2021-11-05 DIAGNOSIS — R519 Headache, unspecified: Secondary | ICD-10-CM | POA: Diagnosis not present

## 2021-11-05 DIAGNOSIS — R200 Anesthesia of skin: Secondary | ICD-10-CM | POA: Insufficient documentation

## 2021-11-05 LAB — ETHANOL: Alcohol, Ethyl (B): <10 mg/dL (ref <10)

## 2021-11-05 LAB — COMPREHENSIVE METABOLIC PANEL
ALT: 42 U/L (ref 0–44)
AST: 43 U/L — ABNORMAL HIGH (ref 15–41)
Albumin: 4.4 g/dL (ref 3.5–5.0)
Alkaline Phosphatase: 53 U/L (ref 38–126)
Anion gap: 7 (ref 5–15)
BUN: 17 mg/dL (ref 6–20)
CO2: 24 mmol/L (ref 22–32)
Calcium: 9.2 mg/dL (ref 8.9–10.3)
Chloride: 106 mmol/L (ref 98–111)
Creatinine, Ser: 1.16 mg/dL (ref 0.61–1.24)
GFR, Estimated: >60 mL/min (ref >60)
Glucose, Bld: 90 mg/dL (ref 70–99)
Potassium: 4.3 mmol/L (ref 3.5–5.1)
Sodium: 137 mmol/L (ref 135–145)
Total Bilirubin: 1.1 mg/dL (ref 0.3–1.2)
Total Protein: 8.3 g/dL — ABNORMAL HIGH (ref 6.5–8.1)

## 2021-11-05 LAB — PROTIME-INR
INR: 1.1 (ref 0.8–1.2)
Prothrombin Time: 13.7 seconds (ref 11.4–15.2)

## 2021-11-05 LAB — DIFFERENTIAL
Abs Immature Granulocytes: 0.02 10*3/uL (ref 0.00–0.07)
Basophils Absolute: 0.1 10*3/uL (ref 0.0–0.1)
Basophils Relative: 1 %
Eosinophils Absolute: 0.2 10*3/uL (ref 0.0–0.5)
Eosinophils Relative: 3 %
Immature Granulocytes: 0 %
Lymphocytes Relative: 24 %
Lymphs Abs: 1.3 10*3/uL (ref 0.7–4.0)
Monocytes Absolute: 0.6 10*3/uL (ref 0.1–1.0)
Monocytes Relative: 12 %
Neutro Abs: 3.4 10*3/uL (ref 1.7–7.7)
Neutrophils Relative %: 60 %

## 2021-11-05 LAB — CBC
HCT: 48.6 % (ref 39.0–52.0)
Hemoglobin: 15.7 g/dL (ref 13.0–17.0)
MCH: 30 pg (ref 26.0–34.0)
MCHC: 32.3 g/dL (ref 30.0–36.0)
MCV: 92.7 fL (ref 80.0–100.0)
Platelets: 286 10*3/uL (ref 150–400)
RBC: 5.24 MIL/uL (ref 4.22–5.81)
RDW: 13.7 % (ref 11.5–15.5)
WBC: 5.6 10*3/uL (ref 4.0–10.5)
nRBC: 0 % (ref 0.0–0.2)

## 2021-11-05 LAB — APTT: aPTT: 23 seconds — ABNORMAL LOW (ref 24–36)

## 2021-11-05 MED ORDER — CYCLOBENZAPRINE HCL 10 MG PO TABS: 10.0000 mg | ORAL_TABLET | Freq: Three times a day (TID) | ORAL | 0 refills | Status: AC | PRN

## 2021-11-05 MED ORDER — IOHEXOL 350 MG/ML SOLN
75.0000 mL | Freq: Once | INTRAVENOUS | Status: AC | PRN
  Administered 2021-11-05: 75 mL via INTRAVENOUS

## 2021-11-05 MED ORDER — KETOROLAC TROMETHAMINE 30 MG/ML IJ SOLN
30.0000 mg | Freq: Once | INTRAMUSCULAR | Status: AC
  Administered 2021-11-05: 30 mg via INTRAVENOUS
  Filled 2021-11-05: qty 1

## 2021-11-05 NOTE — ED Provider Notes (Signed)
Emergency department handoff note  Care of this patient was signed out to me at the end of the previous provider shift.  All pertinent patient information was conveyed and all questions were answered.  Patient pending neurology consult, CT venogram, CT angiography of the head and neck, and MRI of the lumbar spine.  Patient had concerns on MRI head for possible dural venous sinus thrombosis however in consultation with Dr. Parke Simmers in neurology, and after reviewing angiography, patient has low risk for dural venous sinus thrombosis at this time.  Patient's lumbar MRI does show evidence of multilevel facet arthropathy with narrowing of canals especially on the right side of N4/7 which certainly fits with patient's symptomology.  Patient given physical therapy exercises as well as muscle relaxants for recovery and follow-up with spinal surgery if these measures do not improve patient's symptoms. The patient has been reexamined and is ready to be discharged.  All diagnostic results have been reviewed and discussed with the patient/family.  Care plan has been outlined and the patient/family understands all current diagnoses, results, and treatment plans.  There are no new complaints, changes, or physical findings at this time.  All questions have been addressed and answered.  All medications, if any, that were given while in the emergency department or any that are being prescribed have been reviewed with the patient/family.  All side effects and adverse reactions have been explained.  Patient was instructed to, and agrees to follow-up with their primary care physician as well as return to the emergency department if any new or worsening symptoms develop.   Naaman Plummer, MD 11/05/21 2216

## 2021-11-05 NOTE — ED Notes (Signed)
On phone with MRI screener.

## 2021-11-05 NOTE — ED Notes (Signed)
Says last night got up from couch and noticed he could not feel his right foot and lower leg.  Says it feel numb and that he is unable to pick up foot rigth.  Says no problelm with face, but he says he took something for headache and that this am his vision was blurry.

## 2021-11-05 NOTE — ED Triage Notes (Signed)
Pt via POV from home. Pt c/o R sided arm and leg numbness. No facial droop or drift. Pt states it started yesterday around 08:00pm. Pt states that he anxious with no hx of anxiety. States he also endorses blurry vision. Denies hx of stroke. Denies hx of HTN or any other risk factors.

## 2021-11-05 NOTE — ED Provider Notes (Signed)
[facility] Provider Note    Event Date/Time   First MD Initiated Contact with Patient 11/05/21 1320     (approximate)   History   Numbness   HPI  Jared Wright is a 56 y.o. male here with right-sided weakness and headache.  Patient states that his symptoms started last night.  He was watching a movie with his significant other.  He states he got up and noticed that his leg seemed weak and that his foot had difficulty lifting up.  This persisted but mildly improved overnight.  He went to work today, and continues to have the symptoms, as well as diminished sensation that feels "different" on the right arm and leg.  He also developed some clumsiness with his right hand.  He also developed an aching, throbbing headache that is atypical for him.  He subsequent presents for evaluation.  No history of migraines.  No history of prior stroke.  Of note, he had syncopal episodes recently and is scheduled to get a outpatient cardiac monitor with cardiology.     Physical Exam   Triage Vital Signs: ED Triage Vitals  Enc Vitals Group     BP 11/05/21 1137 (!) 143/80     Pulse Rate 11/05/21 1137 86     Resp 11/05/21 1137 18     Temp 11/05/21 1137 98.1 F (36.7 C)     Temp Source 11/05/21 1137 Oral     SpO2 11/05/21 1137 100 %     Weight 11/05/21 1141 185 lb (83.9 kg)     Height 11/05/21 1141 '5\' 8"'$  (1.727 m)     Head Circumference --      Peak Flow --      Pain Score 11/05/21 1141 0     Pain Loc --      Pain Edu? --      Excl. in [place]? --     Most recent vital signs: Vitals:   11/05/21 1310 11/05/21 1715  BP: 129/71 137/73  Pulse: 87 84  Resp: 17 19  Temp: 97.8 F (36.6 C)   SpO2: 97% 97%     General: Awake, no distress.  CV:  Good peripheral perfusion.  Regular rate and rhythm. Resp:  Normal effort.  Abd:  No distention.  No tenderness. Other:  Cranial nerves II through XII intact.  Face symmetric.  Speech normal.  Oriented x4.  On strength  testing, patient has 5 out of 5 strength bilaterally though significantly weaker with right grip strength as well as right lower extremity.  Endorses diminished sensation to light touch in the right upper and lower extremity although not on the right trunk.  No dysmetria.  Gait grossly intact.   ED Results / Procedures / Treatments   Labs (all labs ordered are listed, but only abnormal results are displayed) Labs Reviewed  APTT - Abnormal; Notable for the following components:      Result Value   aPTT 23 (*)    All other components within normal limits  COMPREHENSIVE METABOLIC PANEL - Abnormal; Notable for the following components:   Total Protein 8.3 (*)    AST 43 (*)    All other components within normal limits  PROTIME-INR  CBC  DIFFERENTIAL  ETHANOL  CBG MONITORING, ED     EKG Normal sinus rhythm, ventricular rate 76.  PR 158, QRS 96, QTc 409.  No acute ST elevations or depressions.  No EKG evidence of acute ischemia or infarct.  RADIOLOGY CT head: No acute intracranial abnormality MRI: Pending   I also independently reviewed and agree with radiologist interpretations.   PROCEDURES:  Critical Care performed: No  MEDICATIONS ORDERED IN ED: Medications  iohexol (OMNIPAQUE) 350 MG/ML injection 75 mL (75 mLs Intravenous Contrast Given 11/05/21 1736)     IMPRESSION / MDM / ASSESSMENT AND PLAN / ED COURSE  I reviewed the triage vital signs and the nursing notes.                              Ddx:  Differential includes the following, with pertinent life- or limb-threatening emergencies considered:  CVA, demyelinating disease such as multiple sclerosis, intracranial mass/lesion, atypical migraine, cervical radiculopathy  Patient's presentation is most consistent with acute presentation with potential threat to life or bodily function.  MDM:  56 year old male here with headache, right arm and primarily right leg weakness. No h/o similar issues, no h/o migraines.  DDx broad. Primary concern is CVA vs cervical central canal stenosis, as he has a h/o prior cervical injury and repeated injections. His weakness is most pronounced in the leg, however. No risk factors or sx to suggest osteo or epidural abscess. No other evidence of peripheral neuropathy.   Will start with MR brain/C Spine. If negative, consider MR L spine. Complicated migraine also a consideration though much less likely. Will reach out to Neurology as well re: further work up and imaging.   MEDICATIONS GIVEN IN ED: Medications  iohexol (OMNIPAQUE) 350 MG/ML injection 75 mL (75 mLs Intravenous Contrast Given 11/05/21 1736)     Consults:     EMR reviewed       FINAL CLINICAL IMPRESSION(S) / ED DIAGNOSES   Final diagnoses:  Right sided weakness     Rx / DC Orders   ED Discharge Orders     None        Note:  This document was prepared using Dragon voice recognition software and may include unintentional dictation errors.   Duffy Bruce, MD 11/05/21 [phone]
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
## 2021-11-14 ENCOUNTER — Telehealth: Payer: Self-pay | Admitting: Neurosurgery

## 2021-11-14 DIAGNOSIS — G4700 Insomnia, unspecified: Secondary | ICD-10-CM | POA: Insufficient documentation

## 2021-11-14 NOTE — Telephone Encounter (Signed)
-----   Message from Peggyann Shoals sent at 11/14/2021 10:12 AM EDT ----- Regarding: hospital f/u Contact: [phone] Patient was seen in the ER on 11/06/2021 for right side weakness. Dr.Yarbrough was on call for the Neurosurgery department, he did recommend a follow-up in the office with Geronimo Boot PA. Patient was contacted 3 times from 11/07/2021 through 11/14/2021. When I was finally able to speak to the patient he said that he wanted to think about the appointment and would call if needed.

## 2021-12-04 ENCOUNTER — Ambulatory Visit: Payer: Self-pay | Admitting: Orthopedic Surgery

## 2022-02-27 ENCOUNTER — Other Ambulatory Visit: Payer: Self-pay

## 2022-02-27 DIAGNOSIS — E291 Testicular hypofunction: Secondary | ICD-10-CM

## 2022-02-27 DIAGNOSIS — N5201 Erectile dysfunction due to arterial insufficiency: Secondary | ICD-10-CM

## 2022-03-02 ENCOUNTER — Other Ambulatory Visit: Payer: BC Managed Care – PPO

## 2022-03-02 DIAGNOSIS — N5201 Erectile dysfunction due to arterial insufficiency: Secondary | ICD-10-CM

## 2022-03-02 DIAGNOSIS — E291 Testicular hypofunction: Secondary | ICD-10-CM

## 2022-03-03 ENCOUNTER — Encounter: Payer: Self-pay | Admitting: *Deleted

## 2022-03-03 LAB — HEMATOCRIT: Hematocrit: 51.5 % — ABNORMAL HIGH (ref 37.5–51.0)

## 2022-03-03 LAB — TESTOSTERONE: Testosterone: 1025 ng/dL — ABNORMAL HIGH (ref 264–916)

## 2022-04-02 ENCOUNTER — Other Ambulatory Visit: Payer: Self-pay | Admitting: Urology

## 2022-05-05 ENCOUNTER — Other Ambulatory Visit: Payer: Self-pay | Admitting: Urology

## 2022-05-05 DIAGNOSIS — N5201 Erectile dysfunction due to arterial insufficiency: Secondary | ICD-10-CM

## 2022-05-29 ENCOUNTER — Other Ambulatory Visit: Payer: Self-pay

## 2022-05-29 DIAGNOSIS — E291 Testicular hypofunction: Secondary | ICD-10-CM

## 2022-06-02 ENCOUNTER — Other Ambulatory Visit: Payer: BC Managed Care – PPO

## 2022-06-02 DIAGNOSIS — E291 Testicular hypofunction: Secondary | ICD-10-CM

## 2022-06-03 ENCOUNTER — Encounter: Payer: Self-pay | Admitting: *Deleted

## 2022-06-03 LAB — HEMATOCRIT: Hematocrit: 44.3 % (ref 37.5–51.0)

## 2022-06-03 LAB — TESTOSTERONE: Testosterone: 980 ng/dL — ABNORMAL HIGH (ref 264–916)

## 2022-07-26 DIAGNOSIS — M899 Disorder of bone, unspecified: Secondary | ICD-10-CM | POA: Insufficient documentation

## 2022-07-26 DIAGNOSIS — G894 Chronic pain syndrome: Secondary | ICD-10-CM | POA: Insufficient documentation

## 2022-07-26 DIAGNOSIS — Z789 Other specified health status: Secondary | ICD-10-CM | POA: Insufficient documentation

## 2022-07-26 DIAGNOSIS — Z79899 Other long term (current) drug therapy: Secondary | ICD-10-CM | POA: Insufficient documentation

## 2022-07-26 NOTE — Progress Notes (Unsigned)
Patient: Jared Wright  Service Category: E/M  Provider: Oswaldo Done, MD  DOB: 01-04-1966  DOS: 07/27/2022  Referring Provider: Patterson Hammersmith, MD  MRN: [id number]  Setting: Ambulatory outpatient  PCP: Marina Goodell, MD  Type: New Patient  Specialty: Interventional Pain Management    Location: Office  Delivery: Face-to-face     Primary Reason(s) for Visit: Encounter for initial evaluation of one or more chronic problems (new to examiner) potentially causing chronic pain, and posing a threat to normal musculoskeletal function. (Level of risk: High) CC: No chief complaint on file.  HPI  Mr. Jared Wright is a 57 y.o. year old, male patient, who comes for the first time to our practice referred by Patterson Hammersmith, MD for our initial evaluation of his chronic pain. He has Anxiety; Difficulty sleeping; GERD (gastroesophageal reflux disease); Hypogonadism in male; Seizure [facility]); Tremor, unspecified; Cervical radiculitis; Foraminal stenosis of cervical region; Inflamed epidermoid cyst of skin; Insomnia; Neck pain; Primary osteoarthritis of both hands; SOB (shortness of breath) on exertion; Vasovagal syncope; Chronic pain syndrome; Pharmacologic therapy; Disorder of skeletal system; and Problems influencing health status on their problem list. Today he comes in for evaluation of his No chief complaint on file.  Pain Assessment: Location:     Radiating:   Onset:   Duration:   Quality:   Severity:  /10 (subjective, self-reported pain score)  Effect on ADL:   Timing:   Modifying factors:   BP:    HR:    Onset and Duration: {Hx; Onset and Duration:210120511} Cause of pain: {Hx; Cause:210120521} Severity: {Pain Severity:210120502} Timing: {Symptoms; Timing:210120501} Aggravating Factors: {Causes; Aggravating pain factors:210120507} Alleviating Factors: {Causes; Alleviating Factors:210120500} Associated Problems: {Hx; Associated problems:210120515} Quality of Pain: {Hx; Symptom quality  or Descriptor:210120531} Previous Examinations or Tests: {Hx; Previous examinations or test:210120529} Previous Treatments: {Hx; Previous Treatment:210120503}  Mr. Jared Wright is being evaluated for possible interventional pain management therapies for the treatment of his chronic pain.   ***  Mr. Jared Wright has been informed that this initial visit was an evaluation only.  On the follow up appointment I will go over the results, including ordered tests and available interventional therapies. At that time he will have the opportunity to decide whether to proceed with offered therapies or not. In the event that Mr. Jared Wright prefers avoiding interventional options, this will conclude our involvement in the case.  Medication management recommendations may be provided upon request.  Historic Controlled Substance Pharmacotherapy Review  PMP and historical list of controlled substances: ***  Most recently prescribed opioid analgesics:   *** MME/day: *** mg/day  Historical Monitoring: The patient  reports no history of drug use. List of prior UDS Testing: Lab Results  Component Value Date   ETH <10 11/05/2021   Historical Background Evaluation: [place] PMP: PDMP reviewed during this encounter. Review of the past 61-months conducted.             PMP NARX Score Report:  Narcotic: *** Sedative: *** Stimulant: *** [place] Department of public safety, offender search: Engineer, mining Information) Non-contributory Risk Assessment Profile: Aberrant behavior: None observed or detected today Risk factors for fatal opioid overdose: None identified today PMP NARX Overdose Risk Score: *** Fatal overdose hazard ratio (HR): Calculation deferred Non-fatal overdose hazard ratio (HR): Calculation deferred Risk of opioid abuse or dependence: 0.7-3.0% with doses ? 36 MME/day and 6.1-26% with doses ? 120 MME/day. Substance use disorder (SUD) risk level: See below Personal History of Substance Abuse (SUD-Substance use disorder):   Alcohol:  Illegal Drugs:    Rx Drugs:    ORT Risk Level calculation:    ORT Scoring interpretation table:  Score <3 = Low Risk for SUD  Score between 4-7 = Moderate Risk for SUD  Score >8 = High Risk for Opioid Abuse   PHQ-2 Depression Scale:  Total score:    PHQ-2 Scoring interpretation table: (Score and probability of major depressive disorder)  Score 0 = No depression  Score 1 = 15.4% Probability  Score 2 = 21.1% Probability  Score 3 = 38.4% Probability  Score 4 = 45.5% Probability  Score 5 = 56.4% Probability  Score 6 = 78.6% Probability   PHQ-9 Depression Scale:  Total score:    PHQ-9 Scoring interpretation table:  Score 0-4 = No depression  Score 5-9 = Mild depression  Score 10-14 = Moderate depression  Score 15-19 = Moderately severe depression  Score 20-27 = Severe depression (2.4 times higher risk of SUD and 2.89 times higher risk of overuse)   Pharmacologic Plan: As per protocol, I have not taken over any controlled substance management, pending the results of ordered tests and/or consults.            Initial impression: Pending review of available data and ordered tests.  Meds   Current Outpatient Medications:    clomiPHENE (CLOMID) 50 MG tablet, TAKE ONE-HALF TABLET BY MOUTH ONE TIME DAILY, Disp: 30 tablet, Rfl: 2   cyclobenzaprine (FLEXERIL) 10 MG tablet, Take 1 tablet (10 mg total) by mouth 3 (three) times daily as needed for muscle spasms., Disp: 30 tablet, Rfl: 0   ibuprofen (ADVIL,MOTRIN) 800 MG tablet, Take 800 mg by mouth every 8 (eight) hours as needed., Disp: , Rfl:    mupirocin ointment (BACTROBAN) 2 %, APPLY TO THE AFFECTED AREA BID, Disp: , Rfl:    sildenafil (VIAGRA) 100 MG tablet, TAKE ONE TABLET BY MOUTH ONE TIME DAILY, ONE HOUR PRIOR TO INTERCOURSE, Disp: 30 tablet, Rfl: 4  Imaging Review  Cervical Imaging: Cervical MR wo contrast: Results for orders placed during the hospital encounter of 11/05/21 MR Cervical Spine Wo  Contrast  Narrative CLINICAL DATA:  Right-sided arm and leg numbness  EXAM: MRI CERVICAL SPINE WITHOUT CONTRAST  TECHNIQUE: Multiplanar, multisequence MR imaging of the cervical spine was performed. No intravenous contrast was administered.  COMPARISON:  None Available.  FINDINGS: Alignment: Straightening of normal cervical lordosis.  Vertebrae: Scattered reactive degenerative marrow changes.  Cord: No cord signal abnormality.  Posterior Fossa, vertebral arteries, paraspinal tissues: See separately dictated MRI of the brain for additional findings. There are multiple prominent bilateral cervical lymph nodes which are nonenlarged by size criteria.  Disc levels:  C1-C2: Mild degenerative change.  C2-C3: Severe left facet degenerative change. No disc bulge. No spinal canal stenosis. No right-sided neural foraminal stenosis. Moderate to severe left-sided neural foraminal stenosis. No cord signal abnormality.  C3-C4: Mild right and moderate to severe left-sided facet degenerative change. No right neural foraminal stenosis. Mild-to-moderate left-sided neural foraminal stenosis. No cord signal abnormality. Minimal disc bulge. No spinal canal stenosis.  C4-C5: Severe left and mild right facet degenerative change. Right-sided uncovertebral hypertrophy. Moderate bilateral neural foraminal stenosis. Minimal disc bulge. No spinal canal stenosis. No cord signal abnormality.  C5-C6: Moderate left and mild right facet degenerative change. Right-sided uncovertebral hypertrophy. Severe right neural foraminal stenosis. Moderate left neural foraminal stenosis. Circumferential disc bulge. Mild spinal canal stenosis. No cord signal abnormality.  C6-C7: Mild bilateral facet degenerative change. Bilateral uncovertebral hypertrophy. Moderate to severe  bilateral neural foraminal stenosis. Circumferential disc bulge. Mild spinal canal stenosis. No cord signal abnormality.  C7-T1: No  evidence of high-grade spinal canal or neural foraminal stenosis.  IMPRESSION: 1. Multilevel moderate to severe neural foraminal stenosis secondary to a combination of degenerative facet disease and uncovertebral hypertrophy. Greatest neural foraminal stenosis is seen at C2-C3 (left), C5-C6 (right), C6-C7 (bilateral). 2. No evidence of high-grade spinal canal stenosis or cord signal abnormality.   Electronically Signed By: Lorenza Cambridge M.D. On: 11/05/2021 15:15  Lumbosacral Imaging: Lumbar MR wo contrast: Results for orders placed during the hospital encounter of 11/05/21 MR LUMBAR SPINE WO CONTRAST  Narrative CLINICAL DATA:  Low back pain, symptoms persist with > 6 wks treatment right-sided weakness  EXAM: MRI LUMBAR SPINE WITHOUT CONTRAST  TECHNIQUE: Multiplanar, multisequence MR imaging of the lumbar spine was performed. No intravenous contrast was administered.  COMPARISON:  None Available.  FINDINGS: Segmentation:  Standard.  Alignment: Trace degenerative retrolisthesis at L1-L2, L2-L3, and L3-L4. Trace degenerative anterolisthesis at L4-L5 and L5-S1. Levoconvex curvature.  Vertebrae: There is no evidence of acute fracture, discitis or aggressive osseous lesion. There is degenerative endplate edema at W09-W1 and L1-L2.  Conus medullaris and cauda equina: Conus extends to the T12-L1 level. Conus and cauda equina appear normal.  Paraspinal and other soft tissues: Negative  Disc levels:  T11-T12: No significant spinal canal or neural foraminal narrowing.  T12-L1: Minimal disc bulging.  No significant stenosis.  L1-L2: Asymmetric left disc bulging, ligamentum hypertrophy and mild facet arthropathy. There is mild left lateral recess and neural foraminal narrowing. Mild right neural foraminal narrowing predominantly due to levoconvex curvature.  L2-L3: Trace retrolisthesis with mild disc bulging, ligamentum hypertrophy and mild facet arthropathy. There is  moderate right lateral recess stenosis with potential for impingement of the descending right L3 nerve root. No neural foraminal stenosis.  L3-L4: Trace retrolisthesis with minimal disc bulging. There is mild bilateral lateral recess stenosis without impingement. No significant neural foraminal stenosis.  L4-L5: Trace degenerative anterolisthesis with minimal disc bulging, ligamentum hypertrophy and mild facet arthropathy. There is lateral recess stenosis bilaterally without impingement. There is mild-to-moderate left neural foraminal stenosis. No right neural foraminal stenosis.  L5-S1: Minimal disc bulging with endplate spurring, ligamentum hypertrophy and bilateral facet arthropathy. No spinal canal stenosis. There is moderate left-sided neural foraminal stenosis and abutment of the left exiting nerve root by endplate spurring. Mild right neural foraminal stenosis.  IMPRESSION: No acute lumbar spine fracture.  Multilevel degenerative changes of the lumbar spine, as summarized below:  L1-L2: Mild left lateral recess and neural foraminal stenosis. Mild right neural foraminal narrowing predominantly due to levoconvex curvature.  L2-L3: Moderate right lateral recess stenosis with potential for impingement of the descending right L3 nerve root.  L3-L4: Mild lateral recess stenosis bilaterally without neural impingement.  L4-L5: Mild lateral recess stenosis bilaterally without neural impingement. Mild to moderate left neural foraminal stenosis.  L5-S1: Moderate left-sided neural foraminal stenosis and abutment of left exiting nerve root by endplate spurring. Mild right neural foraminal stenosis.   Electronically Signed By: Caprice Renshaw M.D. On: 11/05/2021 20:41  Complexity Note: Imaging results reviewed.                         ROS  Cardiovascular: {Hx; Cardiovascular History:210120525} Pulmonary or Respiratory: {Hx; Pumonary and/or Respiratory  History:210120523} Neurological: {Hx; Neurological:210120504} Psychological-Psychiatric: {Hx; Psychological-Psychiatric History:210120512} Gastrointestinal: {Hx; Gastrointestinal:210120527} Genitourinary: {Hx; Genitourinary:210120506} Hematological: {Hx; Hematological:210120510} Endocrine: {Hx; Endocrine history:210120509} Rheumatologic: {Hx; Rheumatological:210120530}  Musculoskeletal: {Hx; Musculoskeletal:210120528} Work History: {Hx; Work history:210120514}  Allergies  Mr. Sayed is allergic to ciprofloxacin, elemental sulfur, and sulfamethoxazole-trimethoprim.  Laboratory Chemistry Profile   Renal Lab Results  Component Value Date   BUN 17 11/05/2021   CREATININE 1.16 11/05/2021   GFRAA >60 04/19/2019   GFRNONAA >60 11/05/2021     Electrolytes Lab Results  Component Value Date   NA 137 11/05/2021   K 4.3 11/05/2021   CL 106 11/05/2021   CALCIUM 9.2 11/05/2021     Hepatic Lab Results  Component Value Date   AST 43 (H) 11/05/2021   ALT 42 11/05/2021   ALBUMIN 4.4 11/05/2021   ALKPHOS 53 11/05/2021   LIPASE 24 01/17/2016     ID No results found for: "LYMEIGGIGMAB", "HIV", "SARSCOV2NAA", "STAPHAUREUS", "MRSAPCR", "HCVAB", "PREGTESTUR", "RMSFIGG", "QFVRPH1IGG", "QFVRPH2IGG"   Bone Lab Results  Component Value Date   TESTOSTERONE 980 (H) 06/02/2022     Endocrine Lab Results  Component Value Date   GLUCOSE 90 11/05/2021   TESTOSTERONE 980 (H) 06/02/2022     Neuropathy No results found for: "VITAMINB12", "FOLATE", "HGBA1C", "HIV"   CNS No results found for: "COLORCSF", "APPEARCSF", "RBCCOUNTCSF", "WBCCSF", "POLYSCSF", "LYMPHSCSF", "EOSCSF", "PROTEINCSF", "GLUCCSF", "JCVIRUS", "CSFOLI", "IGGCSF", "LABACHR", "ACETBL"   Inflammation (CRP: Acute  ESR: Chronic) No results found for: "CRP", "ESRSEDRATE", "LATICACIDVEN"   Rheumatology No results found for: "RF", "ANA", "LABURIC", "URICUR", "LYMEIGGIGMAB", "LYMEABIGMQN", "HLAB27"   Coagulation Lab Results   Component Value Date   INR 1.1 11/05/2021   LABPROT 13.7 11/05/2021   APTT 23 (L) 11/05/2021   PLT 286 11/05/2021     Cardiovascular Lab Results  Component Value Date   TROPONINI <0.03 01/17/2016   HGB 15.7 11/05/2021   HCT 44.3 06/02/2022     Screening No results found for: "SARSCOV2NAA", "COVIDSOURCE", "STAPHAUREUS", "MRSAPCR", "HCVAB", "HIV", "PREGTESTUR"   Cancer No results found for: "CEA", "CA125", "LABCA2"   Allergens No results found for: "ALMOND", "APPLE", "ASPARAGUS", "AVOCADO", "BANANA", "BARLEY", "BASIL", "BAYLEAF", "GREENBEAN", "LIMABEAN", "WHITEBEAN", "BEEFIGE", "REDBEET", "BLUEBERRY", "BROCCOLI", "CABBAGE", "MELON", "CARROT", "CASEIN", "CASHEWNUT", "CAULIFLOWER", "CELERY"     Note: Lab results reviewed.  PFSH  Drug: Mr. Wasko  reports no history of drug use. Alcohol:  reports that he does not currently use alcohol. Tobacco:  reports that he has quit smoking. His smoking use included cigarettes. He has never used smokeless tobacco. Medical:  has a past medical history of Acid reflux, History of kidney stones, and Renal disorder. Family: family history is not on file.  Past Surgical History:  Procedure Laterality Date   ESOPHAGEAL DILATION     KNEE ARTHROSCOPY     Active Ambulatory Problems    Diagnosis Date Noted   Anxiety 08/14/2013   Difficulty sleeping 12/19/2015   GERD (gastroesophageal reflux disease) 08/14/2013   Hypogonadism in male 08/14/2013   Seizure (HCC) 01/28/2016   Tremor, unspecified 12/19/2015   Cervical radiculitis 08/07/2019   Foraminal stenosis of cervical region 08/28/2019   Inflamed epidermoid cyst of skin 10/19/2018   Insomnia 11/14/2021   Neck pain 08/07/2019   Primary osteoarthritis of both hands 11/20/2020   SOB (shortness of breath) on exertion 11/04/2021   Vasovagal syncope 11/04/2021   Chronic pain syndrome 07/26/2022   Pharmacologic therapy 07/26/2022   Disorder of skeletal system 07/26/2022   Problems influencing  health status 07/26/2022   Resolved Ambulatory Problems    Diagnosis Date Noted   No Resolved Ambulatory Problems   Past Medical History:  Diagnosis Date   Acid reflux  History of kidney stones    Renal disorder    Constitutional Exam  General appearance: Well nourished, well developed, and well hydrated. In no apparent acute distress There were no vitals filed for this visit. BMI Assessment: Estimated body mass index is 28.13 kg/m as calculated from the following:   Height as of 11/05/21: 5\' 8"  (1.727 m).   Weight as of 11/05/21: 185 lb (83.9 kg).  BMI interpretation table: BMI level Category Range association with higher incidence of chronic pain  <18 kg/m2 Underweight   18.5-24.9 kg/m2 Ideal body weight   25-29.9 kg/m2 Overweight Increased incidence by 20%  30-34.9 kg/m2 Obese (Class I) Increased incidence by 68%  35-39.9 kg/m2 Severe obesity (Class II) Increased incidence by 136%  >40 kg/m2 Extreme obesity (Class III) Increased incidence by 254%   Patient's current BMI Ideal Body weight  There is no height or weight on file to calculate BMI. Patient weight not recorded   BMI Readings from Last 4 Encounters:  11/05/21 28.13 kg/m  08/29/21 29.65 kg/m  08/23/20 28.89 kg/m  08/16/19 27.86 kg/m   Wt Readings from Last 4 Encounters:  11/05/21 185 lb (83.9 kg)  08/29/21 195 lb (88.5 kg)  08/23/20 190 lb (86.2 kg)  08/16/19 177 lb 14.4 oz (80.7 kg)    Psych/Mental status: Alert, oriented x 3 (person, place, & time)       Eyes: PERLA Respiratory: No evidence of acute respiratory distress  Assessment  Primary Diagnosis & Pertinent Problem List: The primary encounter diagnosis was Chronic pain syndrome. Diagnoses of Pharmacologic therapy, Disorder of skeletal system, and Problems influencing health status were also pertinent to this visit.  Visit Diagnosis (New problems to examiner): 1. Chronic pain syndrome   2. Pharmacologic therapy   3. Disorder of skeletal  system   4. Problems influencing health status    Plan of Care (Initial workup plan)  Note: Mr. Masse was reminded that as per protocol, today's visit has been an evaluation only. We have not taken over the patient's controlled substance management.  Problem-specific plan: No problem-specific Assessment & Plan notes found for this encounter.  Lab Orders  No laboratory test(s) ordered today   Imaging Orders  No imaging studies ordered today   Referral Orders  No referral(s) requested today   Procedure Orders    No procedure(s) ordered today   Pharmacotherapy (current): Medications ordered:  No orders of the defined types were placed in this encounter.  Medications administered during this visit: Malikiah Hoole. Dunson "Thayer Ohm" had no medications administered during this visit.   Analgesic Pharmacotherapy:  Opioid Analgesics: For patients currently taking or requesting to take opioid analgesics, in accordance with [facility] Guidelines, we will assess their risks and indications for the use of these substances. After completing our evaluation, we may offer recommendations, but we no longer take patients for medication management. The prescribing physician will ultimately decide, based on his/her training and level of comfort whether to adopt any of the recommendations, including whether or not to prescribe such medicines.  Membrane stabilizer: To be determined at a later time  Muscle relaxant: To be determined at a later time  NSAID: To be determined at a later time  Other analgesic(s): To be determined at a later time   Interventional management options: Mr. Kohlman was informed that there is no guarantee that he would be a candidate for interventional therapies. The decision will be based on the results of diagnostic studies, as well as Mr. Alvizo  risk profile.  Procedure(s) under consideration:  Pending results of ordered studies      Interventional  Therapies  Risk Factors  Considerations:     Planned  Pending:      Under consideration:   Pending   Completed:   None at this time   Therapeutic  Palliative (PRN) options:   None established   Completed by other providers:   None reported       Provider-requested follow-up: No follow-ups on file.  Future Appointments  Date Time Provider Department Center  07/27/2022 11:00 AM Delano Metz, MD [facility] None  08/31/2022  8:30 AM [facility] None  09/04/2022 11:00 AM Stoioff, Verna Czech, MD [facility] None    Duration of encounter: *** minutes.  Total time on encounter, as per AMA guidelines included both the face-to-face and non-face-to-face time personally spent by the physician and/or other qualified health care professional(s) on the day of the encounter (includes time in activities that require the physician or other qualified health care professional and does not include time in activities normally performed by clinical staff). Physician's time may include the following activities when performed: Preparing to see the patient (e.g., pre-charting review of records, searching for previously ordered imaging, lab work, and nerve conduction tests) Review of prior analgesic pharmacotherapies. Reviewing PMP Interpreting ordered tests (e.g., lab work, imaging, nerve conduction tests) Performing post-procedure evaluations, including interpretation of diagnostic procedures Obtaining and/or reviewing separately obtained history Performing a medically appropriate examination and/or evaluation Counseling and educating the patient/family/caregiver Ordering medications, tests, or procedures Referring and communicating with other health care professionals (when not separately reported) Documenting clinical information in the electronic or other health record Independently interpreting results (not separately reported) and communicating results to the patient/ family/caregiver Care  coordination (not separately reported)  Note by: Oswaldo Done, MD (TTS technology used. I apologize for any typographical errors that were not detected and corrected.) Date: 07/27/2022; Time: 7:08 PM

## 2022-07-26 NOTE — Patient Instructions (Signed)
____________________________________________________________________________________________  New Patients  Welcome to [facility].   Initial Visit The first or initial visit consists of an evaluation only.   Interventional pain management.  We offer therapies other than opioid controlled substances to manage chronic pain. These include, but are not limited to, diagnostic, therapeutic, and palliative specialized injection therapies (i.e.: Epidural Steroids, Facet Blocks, etc.). We specialize in a variety of nerve blocks as well as radiofrequency treatments. We offer pain implant evaluations and trials, as well as follow up management. In addition we also provide a variety joint injections, including Viscosupplementation (AKA: Gel Therapy).  Prescription Pain Medication. We specialize in alternatives to opioids. We can provide evaluations and recommendations for/of pharmacologic therapies based on CDC Guidelines.  We no longer take patients for long-term medication management. We will not be taking over your pain medications.  ____________________________________________________________________________________________    ____________________________________________________________________________________________  Patient Information update  To: All of our patients.  Re: Name change.  It has been made official that our current name, "[facility]"   will soon be changed to "[facility]".   The purpose of this change is to eliminate any confusion created by the concept of our practice being a "Medication Management Pain Clinic". In the past this has led to the misconception that we treat pain primarily by the use of prescription medications.  Nothing can be farther from the truth.   Understanding PAIN MANAGEMENT: To  further understand what our practice does, you first have to understand that "Pain Management" is a subspecialty that requires additional training once a physician has completed their specialty training, which can be in either Anesthesia, Neurology, Psychiatry, or Physical Medicine and Rehabilitation (PMR). Each one of these contributes to the final approach taken by each physician to the management of their patient's pain. To be a "Pain Management Specialist" you must have first completed one of the specialty trainings below.  Anesthesiologists - trained in clinical pharmacology and interventional techniques such as nerve blockade and regional as well as central neuroanatomy. They are trained to block pain before, during, and after surgical interventions.  Neurologists - trained in the diagnosis and pharmacological treatment of complex neurological conditions, such as Multiple Sclerosis, Parkinson's, spinal cord injuries, and other systemic conditions that may be associated with symptoms that may include but are not limited to pain. They tend to rely primarily on the treatment of chronic pain using prescription medications.  Psychiatrist - trained in conditions affecting the psychosocial wellbeing of patients including but not limited to depression, anxiety, schizophrenia, personality disorders, addiction, and other substance use disorders that may be associated with chronic pain. They tend to rely primarily on the treatment of chronic pain using prescription medications.   Physical Medicine and Rehabilitation (PMR) physicians, also known as physiatrists - trained to treat a wide variety of medical conditions affecting the brain, spinal cord, nerves, bones, joints, ligaments, muscles, and tendons. Their training is primarily aimed at treating patients that have suffered injuries that have caused severe physical impairment. Their training is primarily aimed at the physical therapy and rehabilitation of those  patients. They may also work alongside orthopedic surgeons or neurosurgeons using their expertise in assisting surgical patients to recover after their surgeries.  INTERVENTIONAL PAIN MANAGEMENT is sub-subspecialty of Pain Management.  Our physicians are Board-certified in Anesthesia, Pain Management, and Interventional Pain Management.  This meaning that not only have they been trained   and Board-certified in their specialty of Anesthesia, and subspecialty of Pain Management, but they have also received further training in the sub-subspecialty of Interventional Pain Management, in order to become Board-certified as INTERVENTIONAL PAIN MANAGEMENT SPECIALIST.    Mission: Our goal is to use our skills in  INTERVENTIONAL PAIN MANAGEMENT as alternatives to the chronic use of prescription opioid medications for the treatment of pain. To make this more clear, we have changed our name to reflect what we do and offer. We will continue to offer medication management assessment and recommendations, but we will not be taking over any patient's medication management.  ____________________________________________________________________________________________     

## 2022-07-27 ENCOUNTER — Ambulatory Visit
Admission: RE | Admit: 2022-07-27 | Discharge: 2022-07-27 | Disposition: A | Discharge status: Home / Self Care | Payer: BC Managed Care – PPO | Source: Ambulatory Visit | Attending: Pain Medicine | Admitting: Pain Medicine

## 2022-07-27 ENCOUNTER — Encounter: Payer: Self-pay | Admitting: Pain Medicine

## 2022-07-27 ENCOUNTER — Other Ambulatory Visit: Admit: 2022-07-27 | Payer: BC Managed Care – PPO

## 2022-07-27 ENCOUNTER — Ambulatory Visit: Payer: BC Managed Care – PPO | Admitting: Pain Medicine

## 2022-07-27 VITALS — BP 127/79 | HR 81 | Temp 98.8°F | Resp 16 | Ht 68.0 in | Wt 186.0 lb

## 2022-07-27 DIAGNOSIS — M5136 Other intervertebral disc degeneration, lumbar region: Secondary | ICD-10-CM | POA: Insufficient documentation

## 2022-07-27 DIAGNOSIS — M25531 Pain in right wrist: Secondary | ICD-10-CM

## 2022-07-27 DIAGNOSIS — G4486 Cervicogenic headache: Secondary | ICD-10-CM

## 2022-07-27 DIAGNOSIS — M4316 Spondylolisthesis, lumbar region: Secondary | ICD-10-CM | POA: Insufficient documentation

## 2022-07-27 DIAGNOSIS — M4317 Spondylolisthesis, lumbosacral region: Secondary | ICD-10-CM

## 2022-07-27 DIAGNOSIS — M5459 Other low back pain: Secondary | ICD-10-CM

## 2022-07-27 DIAGNOSIS — R937 Abnormal findings on diagnostic imaging of other parts of musculoskeletal system: Secondary | ICD-10-CM | POA: Insufficient documentation

## 2022-07-27 DIAGNOSIS — R200 Anesthesia of skin: Secondary | ICD-10-CM

## 2022-07-27 DIAGNOSIS — M79641 Pain in right hand: Secondary | ICD-10-CM | POA: Insufficient documentation

## 2022-07-27 DIAGNOSIS — M79642 Pain in left hand: Secondary | ICD-10-CM

## 2022-07-27 DIAGNOSIS — Z789 Other specified health status: Secondary | ICD-10-CM | POA: Insufficient documentation

## 2022-07-27 DIAGNOSIS — M51369 Other intervertebral disc degeneration, lumbar region without mention of lumbar back pain or lower extremity pain: Secondary | ICD-10-CM

## 2022-07-27 DIAGNOSIS — M4310 Spondylolisthesis, site unspecified: Secondary | ICD-10-CM | POA: Insufficient documentation

## 2022-07-27 DIAGNOSIS — M549 Dorsalgia, unspecified: Secondary | ICD-10-CM

## 2022-07-27 DIAGNOSIS — M899 Disorder of bone, unspecified: Secondary | ICD-10-CM

## 2022-07-27 DIAGNOSIS — M542 Cervicalgia: Secondary | ICD-10-CM | POA: Insufficient documentation

## 2022-07-27 DIAGNOSIS — M545 Low back pain, unspecified: Secondary | ICD-10-CM | POA: Insufficient documentation

## 2022-07-27 DIAGNOSIS — G894 Chronic pain syndrome: Secondary | ICD-10-CM | POA: Diagnosis not present

## 2022-07-27 DIAGNOSIS — M5030 Other cervical disc degeneration, unspecified cervical region: Secondary | ICD-10-CM | POA: Insufficient documentation

## 2022-07-27 DIAGNOSIS — M25532 Pain in left wrist: Secondary | ICD-10-CM

## 2022-07-27 DIAGNOSIS — Z79899 Other long term (current) drug therapy: Secondary | ICD-10-CM | POA: Insufficient documentation

## 2022-07-27 DIAGNOSIS — R519 Headache, unspecified: Secondary | ICD-10-CM

## 2022-07-27 DIAGNOSIS — G8929 Other chronic pain: Secondary | ICD-10-CM

## 2022-07-27 DIAGNOSIS — M4186 Other forms of scoliosis, lumbar region: Secondary | ICD-10-CM | POA: Insufficient documentation

## 2022-07-29 LAB — COMPLIANCE DRUG ANALYSIS, UR

## 2022-08-01 LAB — 25-HYDROXY VITAMIN D LCMS D2+D3
25-Hydroxy, Vitamin D-2: <1 ng/mL
25-Hydroxy, Vitamin D-3: 77 ng/mL
25-Hydroxy, Vitamin D: 77 ng/mL

## 2022-08-01 LAB — C-REACTIVE PROTEIN: CRP: <1 mg/L (ref 0–10)

## 2022-08-01 LAB — COMP. METABOLIC PANEL (12)
AST: 58 IU/L — ABNORMAL HIGH (ref 0–40)
Albumin/Globulin Ratio: 1.7
Albumin: 4.3 g/dL (ref 3.8–4.9)
Alkaline Phosphatase: 75 IU/L (ref 44–121)
BUN/Creatinine Ratio: 13 (ref 9–20)
BUN: 15 mg/dL (ref 6–24)
Bilirubin Total: 0.6 mg/dL (ref 0.0–1.2)
Calcium: 9.3 mg/dL (ref 8.7–10.2)
Chloride: 104 mmol/L (ref 96–106)
Creatinine, Ser: 1.16 mg/dL (ref 0.76–1.27)
Globulin, Total: 2.5 g/dL (ref 1.5–4.5)
Glucose: 104 mg/dL — ABNORMAL HIGH (ref 70–99)
Potassium: 4.6 mmol/L (ref 3.5–5.2)
Sodium: 142 mmol/L (ref 134–144)
Total Protein: 6.8 g/dL (ref 6.0–8.5)
eGFR: 74 mL/min/{1.73_m2} (ref >59)

## 2022-08-01 LAB — SEDIMENTATION RATE: Sed Rate: 8 mm/hr (ref 0–30)

## 2022-08-01 LAB — VITAMIN B12: Vitamin B-12: 544 pg/mL (ref 232–1245)

## 2022-08-01 LAB — MAGNESIUM: Magnesium: 2.3 mg/dL (ref 1.6–2.3)

## 2022-08-10 NOTE — Progress Notes (Signed)
PROVIDER NOTE: Information contained herein reflects review and annotations entered in association with encounter. Interpretation of such information and data should be left to medically-trained personnel. Information provided to patient can be located elsewhere in the medical record under "Patient Instructions". Document created using STT-dictation technology, any transcriptional errors that may result from process are unintentional.    Patient: Jared Wright  Service Category: E/M  Provider: Oswaldo Done, MD  DOB: Aug 26, 1965  DOS: 08/12/2022  Referring Provider: Marina Goodell, MD  MRN: [id number]  Specialty: Interventional Pain Management  PCP: Marina Goodell, MD  Type: Established Patient  Setting: Ambulatory outpatient    Location: Office  Delivery: Face-to-face     Primary Reason(s) for Visit: Encounter for evaluation before starting new chronic pain management plan of care (Level of risk: moderate) CC: Back Pain (lower)  HPI  Jared Wright is a 56 y.o. year old, male patient, who comes today for a follow-up evaluation to review the test results and decide on a treatment plan. He has Anxiety; Difficulty sleeping; GERD (gastroesophageal reflux disease); Hypogonadism in male; Seizure [facility]); Tremor, unspecified; Cervical radiculitis; Cervical foraminal stenosis (C6-7) (Bilateral); Inflamed epidermoid cyst of skin; Insomnia; Cervicalgia; Osteoarthritis of hands (Bilateral); SOB (shortness of breath) on exertion; Vasovagal syncope; Chronic pain syndrome; Pharmacologic therapy; Disorder of skeletal system; Problems influencing health status; Chronic low back pain (1ry area of Pain) (Bilateral) (L>R) w/o sciatica; Lumbar facet joint pain; Chronic wrist pain (Bilateral); Chronic hand pain (Bilateral); Chronic neck (2ry area of Pain) (Bilateral) and back pain (Right); Chronic recurrent occipital headache (Bilateral); Cervicogenic headache; Cervical facet joint pain; DDD (degenerative disc  disease), cervical; DDD (degenerative disc disease), lumbar; Grade 1 Lumbar Retrolisthesis of L1/L2, L2/L3, & L3/L4; Grade 1 Lumbar Anterolisthesis of L4/L5; Grade 1 Lumbosacral Anterolisthesis of L5/S1; Levoscoliosis of lumbar spine; Abnormal MRI, lumbar spine (11/05/2021); Abnormal MRI, cervical spine (11/05/2021); Lower extremity intermittent numbness (Bilateral); Cervical facet hypertrophy (Multilevel) (Bilateral); Osteoarthritis of hand (Left); Osteoarthritis of hand (Right); Osteoarthritis of lumbar spine; Lumbosacral facet arthropathy (Multilevel) (Bilateral); and Lumbosacral facet joint syndrome on their problem list. His primarily concern today is the Back Pain (lower)  Pain Assessment: Location: Left, Right Back Radiating: Radiaties everywhere Onset: More than a month ago Duration: Chronic pain Quality: Aching, Burning, Constant, Tightness, Throbbing, Stabbing, Discomfort Severity: 6 /10 (subjective, self-reported pain score)  Effect on ADL: limits my daily activities Timing: Constant Modifying factors: nothing BP: 117/78  HR: 71  Jared Wright comes in today for a follow-up visit after his initial evaluation on 07/27/2022. Today we went over the results of his tests. These were explained in "Layman's terms". During today's appointment we went over my diagnostic impression, as well as the proposed treatment plan.  Review of initial evaluation (07/27/2022): "According to the patient the primary area of pain is that of the lower back (Bilateral) (L>R).  He denies any prior surgeries, physical therapy, or nerve blocks.  He does indicate recently having had some x-rays and an MRI of the lumbar spine.  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   The patient's secondary area pain/numbness is that of the lower extremities (Bilateral) (R>L).  He indicates that the primary area of pain  is that of the lower back followed by the lower extremity symptoms which are primarily episodes of intermittent numbness.  These episodes of numbness are triggered by prolonged standing.  He denies any prior lower extremity surgeries except for a knee surgery on the right side.  He describes them the only area where he normally will experience pain is in the top of his feet (Bilateral) (R=L).  This seems to affect the distal portion of his feet including all of his toes.   The patient's third area pain is that of the neck (posterior) (Bilateral) (L>R).  He refers that at age 34 he ended up fracturing his neck but this was misdiagnosed and it was not until much later that they found out about the fracture.  He does admit having daily headaches in the occipital region of the head and referring pain to the top of the head and what appears to be the distribution of the greater occipital nerve.  He indicates that this neck pain also seems to refer pain down both shoulders, primarily on the right side.  Very rarely he will also experience pain in the upper extremities primarily in the areas of the wrist and fingers, without necessarily running down the arm.  He describes that when it affects his fingers, it will affect all of them, bilaterally.  He does have a history of having fractured his left thumb.   Physical exam today demonstrated the patient to be able to toe walk and heel walk without any problems.  Lumbar flexion was within normal limits but it did trigger pain in the lower portion of the back, bilaterally, primarily in the flexion portion of the maneuver.  Lumbar hyperextension was positive for bilateral low back pain.  Lumbar lateral bending was negative bilaterally.  Lumbar hyperextension rotation was positive bilaterally with the left side being worse than the right.  The pain trigger was ipsilateral to the side that he was turning into.  The Patrick maneuver was negative bilaterally for sacroiliac joint  or hip arthralgias.  Cervical range of motion was decreased especially towards the left side.  Rotating the head towards the left side triggered pain in the neck and trapezius muscle, ipsilaterally."  Review of ordered test on 07/27/2022: Comprehensive metabolic panel showed a mildly elevated blood glucose and AST levels.  The rest of the lab work was within normal limits.  X-rays of the cervical spine demonstrated multilevel degenerative disc disease from C3-4 through C6-7 with mild multilevel facet hypertrophy.  In addition it also demonstrated bilateral neuroforaminal stenosis at C6-7.  Diagnostic x-rays of the left hand demonstrated osteoarthritis of the thumb, distal interphalangeal joints, and third digit metacarpophalangeal joint.  The fifth digit is held in flexion at the proximal interphalangeal joint on all views.  Diagnostic x-rays of the right hand again show osteoarthritis of the distal interphalangeal joints as well as metacarpophalangeal joints of the thumb and third digit.  The fifth digit is held in flexion at the proximal interphalangeal joint on all views.  Diagnostic x-rays of the lumbar spine with bending views show multilevel degenerative disc disease, L4-5 and L5-S1 facet hypertrophy, mild degenerative retrolisthesis of L1 over L2 and L2 over L3 which is unchanged on flexion and extension views.  There is also grade 1 retrolisthesis of L3 over L4 which is 2 mm in the neutral position and 0 mm on flexion and extension.  Trace degenerative anterolisthesis of L4 over L5 (unchanged on flexion and extension views).  There is levoscoliosis centered at L1-2.  Diagnostic x-rays of the left wrist show osteoarthritis of the radiocarpal joint and thumb carpometacarpal joint.  Diagnostic x-rays of the right wrist show moderate radiocarpal  and distal radial ulnar osteoarthritis.  Cystic changes in the carpal bones are typically degenerative.  Today I have provided the patient with our copies of all of  the above x-rays including the official reports on his MRIs.  Because his primary area of pain is out of the lower back, we will begin treatment with diagnostic bilateral lumbar facet blocks.  The first 1 will be conducted using fluoroscopic mapping for the purpose of determining which of the facet joints are in fact clinically affected.  I have explained to the patient the process and the reasoning behind a diagnostic injections including risk and possible complications.  I have also explained to the patient that should on follow-up we were to determine that there is in fact the involvement of the lumbar facet joints, we may eventually proceed with radiofrequency ablation for the purpose of providing him with longer lasting benefit.  He had a couple questions all of which I have answered for him.  He did have some questions about an unrelated incident where he fell in the shower and apparently hit his left leg and developed hematoma.  I have given him some recommendations on the use of heat to assist with the absorption of this hematoma.  This apparently happened before his last visit to our clinics so it is not an acute incident.  Evaluation of the area demonstrated no current evidence of skin bruising or skin lesions.  He indicates that it simply feels like there is a bump in the area.  His physical exam remains unchanged from above.  Patient presented with interventional treatment options. Jared Wright was informed that I will not be providing medication management. Pharmacotherapy evaluation including recommendations may be offered, if specifically requested.   Controlled Substance Pharmacotherapy Assessment REMS (Risk Evaluation and Mitigation Strategy)  Opioid Analgesic: None MME/day: 0 mg/day  Pill Count: None expected due to no prior prescriptions written by our practice. Brigitte Pulse, RN  08/12/2022  8:09 AM  Sign when Signing Visit Safety precautions to be maintained throughout the outpatient  stay will include: orient to surroundings, keep bed in low position, maintain call bell within reach at all times, provide assistance with transfer out of bed and ambulation.    Pharmacokinetics: Liberation and absorption (onset of action): WNL Distribution (time to peak effect): WNL Metabolism and excretion (duration of action): WNL         Pharmacodynamics: Desired effects: Analgesia: Jared Wright reports >50% benefit. Functional ability: Patient reports that medication allows him to accomplish basic ADLs Clinically meaningful improvement in function (CMIF): Sustained CMIF goals met Perceived effectiveness: Described as relatively effective, allowing for increase in activities of daily living (ADL) Undesirable effects: Side-effects or Adverse reactions: None reported Monitoring: [place] PMP: PDMP reviewed during this encounter. Online review of the past 59-month period previously conducted. Not applicable at this point since we have not taken over the patient's medication management yet. List of other Serum/Urine Drug Screening Test(s):  Lab Results  Component Value Date   ETH <10 11/05/2021   List of all UDS test(s) done:  Lab Results  Component Value Date   SUMMARY Note 07/27/2022   Last UDS on record: Summary  Date Value Ref Range Status  07/27/2022 Note  Final    Comment:    ==================================================================== Compliance Drug Analysis, Ur ==================================================================== Test                             Result  Flag       Units  Drug Present and Declared for Prescription Verification   Gabapentin                     PRESENT      EXPECTED  Drug Present not Declared for Prescription Verification   Alprazolam                     21           UNEXPECTED ng/mg creat    Source of alprazolam is a scheduled prescription medication.  Drug Absent but Declared for Prescription Verification   Cyclobenzaprine                 Not Detected UNEXPECTED   Ibuprofen                      Not Detected UNEXPECTED    Ibuprofen, as indicated in the declared medication list, is not    always detected even when used as directed.  ==================================================================== Test                      Result    Flag   Units      Ref Range   Creatinine              111              mg/dL      >=16 ==================================================================== Declared Medications:  The flagging and interpretation on this report are based on the  following declared medications.  Unexpected results may arise from  inaccuracies in the declared medications.   **Note: The testing scope of this panel includes these medications:   Cyclobenzaprine (Flexeril)  Gabapentin (Neurontin)   **Note: The testing scope of this panel does not include small to  moderate amounts of these reported medications:   Ibuprofen (Advil)   **Note: The testing scope of this panel does not include the  following reported medications:   Clomiphene  Mupirocin (Bactroban)  Sildenafil (Viagra) ==================================================================== For clinical consultation, please call [phone]. ====================================================================    UDS interpretation: No unexpected findings.          Medication Assessment Form: Not applicable. No opioids. Treatment compliance: Not applicable Risk Assessment Profile: Aberrant behavior: See initial evaluations. None observed or detected today Comorbid factors increasing risk of overdose: See initial evaluation. No additional risks detected today Opioid risk tool (ORT):     07/27/2022   11:16 AM  Opioid Risk   Alcohol 0  Illegal Drugs 0  Rx Drugs 0  Alcohol 0  Illegal Drugs 0  Rx Drugs 0  Age between 16-45 years  0  History of Preadolescent Sexual Abuse 0  Psychological Disease 0  Depression 0  Opioid Risk  Tool Scoring 0  Opioid Risk Interpretation Low Risk    ORT Scoring interpretation table:  Score <3 = Low Risk for SUD  Score between 4-7 = Moderate Risk for SUD  Score >8 = High Risk for Opioid Abuse   Risk of substance use disorder (SUD): Low  Risk Mitigation Strategies:  Patient opioid safety counseling: No controlled substances prescribed. Patient-Prescriber Agreement (PPA): No agreement signed.  Controlled substance notification to other providers: None required. No opioid therapy.  Pharmacologic Plan: Non-opioid analgesic therapy offered. Interventional alternatives discussed.             Laboratory Chemistry Profile   Renal Lab Results  Component Value Date   BUN 15 07/27/2022   CREATININE 1.16 07/27/2022   BCR 13 07/27/2022   GFRAA >60 04/19/2019   GFRNONAA >60 11/05/2021     Electrolytes Lab Results  Component Value Date   NA 142 07/27/2022   K 4.6 07/27/2022   CL 104 07/27/2022   CALCIUM 9.3 07/27/2022   MG 2.3 07/27/2022     Hepatic Lab Results  Component Value Date   AST 58 (H) 07/27/2022   ALT 42 11/05/2021   ALBUMIN 4.3 07/27/2022   ALKPHOS 75 07/27/2022   LIPASE 24 01/17/2016     ID No results found for: "LYMEIGGIGMAB", "HIV", "SARSCOV2NAA", "STAPHAUREUS", "MRSAPCR", "HCVAB", "PREGTESTUR", "RMSFIGG", "QFVRPH1IGG", "QFVRPH2IGG"   Bone Lab Results  Component Value Date   25OHVITD1 77 07/27/2022   25OHVITD2 <1.0 07/27/2022   25OHVITD3 77 07/27/2022   TESTOSTERONE 980 (H) 06/02/2022     Endocrine Lab Results  Component Value Date   GLUCOSE 104 (H) 07/27/2022   TESTOSTERONE 980 (H) 06/02/2022     Neuropathy Lab Results  Component Value Date   VITAMINB12 544 07/27/2022     CNS No results found for: "COLORCSF", "APPEARCSF", "RBCCOUNTCSF", "WBCCSF", "POLYSCSF", "LYMPHSCSF", "EOSCSF", "PROTEINCSF", "GLUCCSF", "JCVIRUS", "CSFOLI", "IGGCSF", "LABACHR", "ACETBL"   Inflammation (CRP: Acute  ESR: Chronic) Lab Results  Component Value Date    CRP <1 07/27/2022   ESRSEDRATE 8 07/27/2022     Rheumatology No results found for: "RF", "ANA", "LABURIC", "URICUR", "LYMEIGGIGMAB", "LYMEABIGMQN", "HLAB27"   Coagulation Lab Results  Component Value Date   INR 1.1 11/05/2021   LABPROT 13.7 11/05/2021   APTT 23 (L) 11/05/2021   PLT 286 11/05/2021     Cardiovascular Lab Results  Component Value Date   TROPONINI <0.03 01/17/2016   HGB 15.7 11/05/2021   HCT 44.3 06/02/2022     Screening No results found for: "SARSCOV2NAA", "COVIDSOURCE", "STAPHAUREUS", "MRSAPCR", "HCVAB", "HIV", "PREGTESTUR"   Cancer No results found for: "CEA", "CA125", "LABCA2"   Allergens No results found for: "ALMOND", "APPLE", "ASPARAGUS", "AVOCADO", "BANANA", "BARLEY", "BASIL", "BAYLEAF", "GREENBEAN", "LIMABEAN", "WHITEBEAN", "BEEFIGE", "REDBEET", "BLUEBERRY", "BROCCOLI", "CABBAGE", "MELON", "CARROT", "CASEIN", "CASHEWNUT", "CAULIFLOWER", "CELERY"     Note: Lab results reviewed.  Recent Diagnostic Imaging Review  Cervical Imaging: Cervical MR wo contrast: Results for orders placed during the hospital encounter of 11/05/21 MR Cervical Spine Wo Contrast  Narrative CLINICAL DATA:  Right-sided arm and leg numbness  EXAM: MRI CERVICAL SPINE WITHOUT CONTRAST  TECHNIQUE: Multiplanar, multisequence MR imaging of the cervical spine was performed. No intravenous contrast was administered.  COMPARISON:  None Available.  FINDINGS: Alignment: Straightening of normal cervical lordosis.  Vertebrae: Scattered reactive degenerative marrow changes.  Cord: No cord signal abnormality.  Posterior Fossa, vertebral arteries, paraspinal tissues: See separately dictated MRI of the brain for additional findings. There are multiple prominent bilateral cervical lymph nodes which are nonenlarged by size criteria.  Disc levels:  C1-C2: Mild degenerative change.  C2-C3: Severe left facet degenerative change. No disc bulge. No spinal canal stenosis. No  right-sided neural foraminal stenosis. Moderate to severe left-sided neural foraminal stenosis. No cord signal abnormality.  C3-C4: Mild right and moderate to severe left-sided facet degenerative change. No right neural foraminal stenosis. Mild-to-moderate left-sided neural foraminal stenosis. No cord signal abnormality. Minimal disc bulge. No spinal canal stenosis.  C4-C5: Severe left and mild right facet degenerative change. Right-sided uncovertebral hypertrophy. Moderate bilateral neural foraminal stenosis. Minimal disc bulge. No spinal canal stenosis. No cord signal abnormality.  C5-C6: Moderate left and mild right facet degenerative  change. Right-sided uncovertebral hypertrophy. Severe right neural foraminal stenosis. Moderate left neural foraminal stenosis. Circumferential disc bulge. Mild spinal canal stenosis. No cord signal abnormality.  C6-C7: Mild bilateral facet degenerative change. Bilateral uncovertebral hypertrophy. Moderate to severe bilateral neural foraminal stenosis. Circumferential disc bulge. Mild spinal canal stenosis. No cord signal abnormality.  C7-T1: No evidence of high-grade spinal canal or neural foraminal stenosis.  IMPRESSION: 1. Multilevel moderate to severe neural foraminal stenosis secondary to a combination of degenerative facet disease and uncovertebral hypertrophy. Greatest neural foraminal stenosis is seen at C2-C3 (left), C5-C6 (right), C6-C7 (bilateral). 2. No evidence of high-grade spinal canal stenosis or cord signal abnormality.   Electronically Signed By: Lorenza Cambridge M.D. On: 11/05/2021 15:15  Cervical DG Bending/F/E views: Results for orders placed during the hospital encounter of 07/27/22 DG Cervical Spine With Flex & Extend  Narrative CLINICAL DATA:  Cervicalgia. Chronic neck pain. Occipital headache. Cervicogenic headache. Pain of cervical facet joint. Degenerative disc disease, cervical.  EXAM: CERVICAL SPINE COMPLETE  WITH FLEXION AND EXTENSION VIEWS  COMPARISON:  None Available.  FINDINGS: Mild straightening of normal lordosis. No listhesis. Mild broad-based dextroscoliotic curvature. No abnormal motion on flexion or extension. Disc space narrowing with spurring from C3-C4 through C6-C7. Mild multilevel facet hypertrophy, more so on the left. There is bilateral neural foraminal stenosis at C6-C7. No evidence of fracture or focal bone abnormality. No prevertebral soft tissue thickening.  IMPRESSION: 1. Multilevel degenerative disc disease from C3-C4 through C6-C7. Mild multilevel facet hypertrophy. 2. Bilateral neural foraminal stenosis at C6-C7.   Electronically Signed By: Narda Rutherford M.D. On: 08/02/2022 14:41  Lumbosacral Imaging: Lumbar MR wo contrast: Results for orders placed during the hospital encounter of 11/05/21 MR LUMBAR SPINE WO CONTRAST  Narrative CLINICAL DATA:  Low back pain, symptoms persist with > 6 wks treatment right-sided weakness  EXAM: MRI LUMBAR SPINE WITHOUT CONTRAST  TECHNIQUE: Multiplanar, multisequence MR imaging of the lumbar spine was performed. No intravenous contrast was administered.  COMPARISON:  None Available.  FINDINGS: Segmentation:  Standard.  Alignment: Trace degenerative retrolisthesis at L1-L2, L2-L3, and L3-L4. Trace degenerative anterolisthesis at L4-L5 and L5-S1. Levoconvex curvature.  Vertebrae: There is no evidence of acute fracture, discitis or aggressive osseous lesion. There is degenerative endplate edema at T73-U2 and L1-L2.  Conus medullaris and cauda equina: Conus extends to the T12-L1 level. Conus and cauda equina appear normal.  Paraspinal and other soft tissues: Negative  Disc levels:  T11-T12: No significant spinal canal or neural foraminal narrowing.  T12-L1: Minimal disc bulging.  No significant stenosis.  L1-L2: Asymmetric left disc bulging, ligamentum hypertrophy and mild facet arthropathy. There is mild  left lateral recess and neural foraminal narrowing. Mild right neural foraminal narrowing predominantly due to levoconvex curvature.  L2-L3: Trace retrolisthesis with mild disc bulging, ligamentum hypertrophy and mild facet arthropathy. There is moderate right lateral recess stenosis with potential for impingement of the descending right L3 nerve root. No neural foraminal stenosis.  L3-L4: Trace retrolisthesis with minimal disc bulging. There is mild bilateral lateral recess stenosis without impingement. No significant neural foraminal stenosis.  L4-L5: Trace degenerative anterolisthesis with minimal disc bulging, ligamentum hypertrophy and mild facet arthropathy. There is lateral recess stenosis bilaterally without impingement. There is mild-to-moderate left neural foraminal stenosis. No right neural foraminal stenosis.  L5-S1: Minimal disc bulging with endplate spurring, ligamentum hypertrophy and bilateral facet arthropathy. No spinal canal stenosis. There is moderate left-sided neural foraminal stenosis and abutment of the left exiting nerve root by endplate spurring.  Mild right neural foraminal stenosis.  IMPRESSION: No acute lumbar spine fracture.  Multilevel degenerative changes of the lumbar spine, as summarized below:  L1-L2: Mild left lateral recess and neural foraminal stenosis. Mild right neural foraminal narrowing predominantly due to levoconvex curvature.  L2-L3: Moderate right lateral recess stenosis with potential for impingement of the descending right L3 nerve root.  L3-L4: Mild lateral recess stenosis bilaterally without neural impingement.  L4-L5: Mild lateral recess stenosis bilaterally without neural impingement. Mild to moderate left neural foraminal stenosis.  L5-S1: Moderate left-sided neural foraminal stenosis and abutment of left exiting nerve root by endplate spurring. Mild right neural foraminal stenosis.   Electronically Signed By: Caprice Renshaw M.D. On: 11/05/2021 20:41  Lumbar DG Bending views: Results for orders placed during the hospital encounter of 07/27/22 DG Lumbar Spine Complete W/Bend  Narrative CLINICAL DATA:  Chronic bilateral low back pain without sciatica. Lumbar facet joint pain. Degenerative disc disease, lumbar. Anterolisthesis of lumbar spine. Levoscoliosis of lumbar spine.  Low back pain.  EXAM: LUMBAR SPINE - COMPLETE WITH BENDING VIEWS  COMPARISON:  Lumbar MRI 11/05/2021  FINDINGS: There are 5 non-rib-bearing lumbar vertebra. Levo scoliotic curvature centered at L1-L2. There is trace degenerative retrolisthesis of L1 on L2, L2 on L3, and L3 on L4, trace degenerative anterolisthesis of L4 on L5. All of these levels are unchanged on flexion and extension with the exception of L3-L4 which is 2 mm on neutral and 0 mm on flexion and extension.  Disc space narrowing and spurring from T12-L1 through L5-S1, least affecting L4-L5. Vacuum phenomena at L5-S1. There is moderate L4-L5 and L5-S1 facet hypertrophy. No evidence of fracture, pars defects or focal bone abnormalities. The sacroiliac joints are congruent.  IMPRESSION: 1. Multilevel degenerative disc disease. L4-L5 and L5-S1 facet hypertrophy. 2. Mild degenerative retrolisthesis of L1 on L2 and L2 on L3 unchanged on flexion and extension. Grade 1 retrolisthesis of L3 on L4 is 2 mm on neutral, 0 mm on flexion and extension. 3. Trace degenerative anterolisthesis of L4 on L5, unchanged on flexion and extension. 4. Levo scoliosis centered at L1-L2.   Electronically Signed By: Narda Rutherford M.D. On: 08/02/2022 14:34  Wrist Imaging: Wrist-R DG Complete: Results for orders placed during the hospital encounter of 07/27/22 DG Wrist Complete Right  Narrative CLINICAL DATA:  Chronic bilateral wrist pain.  EXAM: RIGHT WRIST - COMPLETE 3+ VIEW  COMPARISON:  None Available.  FINDINGS: Moderate radiocarpal joint space narrowing. Mild ulna  positive variance. There is degenerative spurring of the distal radioulnar joint. Cystic changes in the carpal bones are typically degenerative. No fracture. No definite erosions. No focal soft tissue abnormalities.  IMPRESSION: 1. Moderate radiocarpal and distal radioulnar osteoarthritis. 2. Cystic changes in the carpal bones are typically degenerative.   Electronically Signed By: Narda Rutherford M.D. On: 08/02/2022 14:39  Wrist-L DG Complete: Results for orders placed during the hospital encounter of 07/27/22 DG Wrist Complete Left  Narrative CLINICAL DATA:  Chronic bilateral wrist pain.  EXAM: LEFT WRIST - COMPLETE 3+ VIEW  COMPARISON:  None Available.  FINDINGS: Normal wrist alignment. Mild radiocarpal joint space narrowing. Joint space narrowing with spurring and osteophytes at the thumb carpal metacarpal joint. No erosions. No periostitis or focal bone abnormality. No fracture. No focal soft tissue abnormalities are seen.  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   Electronically Signed By: Narda Rutherford M.D. On: 08/02/2022 14:38  Hand Imaging: Hand-R DG Complete: Results for orders placed during  the hospital encounter of 07/27/22 DG Hand Complete Right  Narrative CLINICAL DATA:  Chronic bilateral hand pain.  EXAM: RIGHT HAND - COMPLETE 3+ VIEW  COMPARISON:  None Available.  FINDINGS: Bone mineralization is subjectively normal. The fifth digit is held in flexion at the proximal interphalangeal joint on all views. Radiocarpal joint space narrowing which is better assessed on concurrent wrist exam. Osteophytes involving the distal interphalangeal joints as well as metacarpal phalangeal joint of the thumb and third digit. No erosions or periostitis. No fracture no focal soft tissue abnormalities.  IMPRESSION: 1. Osteoarthritis of the distal interphalangeal joints as well as metacarpophalangeal joint of  the thumb and third digit. 2. Fifth digit is held in flexion at the proximal interphalangeal joint on all views.   Electronically Signed By: Narda Rutherford M.D. On: 08/02/2022 14:37  Hand-L DG Complete: Results for orders placed during the hospital encounter of 07/27/22 DG Hand Complete Left  Narrative CLINICAL DATA:  Chronic bilateral hand pain.  EXAM: LEFT HAND - COMPLETE 3+ VIEW  COMPARISON:  None Available.  FINDINGS: Bone mineralization is subjectively normal. The fifth digit is held in flexion at the proximal interphalangeal joint on all views. Slight radial subluxation of the thumb at the carpal metacarpal joint with associated joint space narrowing, spurring and subchondral cystic change, degenerative. Osteophytes at the distal interphalangeal joints and third digit metacarpal phalangeal joint. No erosive change or periostitis. No fracture. No focal soft tissue abnormalities.  IMPRESSION: 1. Osteoarthritis of the thumb, distal interphalangeal joints, and third digit metacarpophalangeal joint. 2. The fifth digit is held in flexion at the proximal interphalangeal joint on all views.   Electronically Signed By: Narda Rutherford M.D. On: 08/02/2022 14:35  Complexity Note: Imaging results reviewed.                         Meds   Current Outpatient Medications:    clomiPHENE (CLOMID) 50 MG tablet, TAKE ONE-HALF TABLET BY MOUTH ONE TIME DAILY, Disp: 30 tablet, Rfl: 2   cyclobenzaprine (FLEXERIL) 10 MG tablet, Take 1 tablet (10 mg total) by mouth 3 (three) times daily as needed for muscle spasms., Disp: 30 tablet, Rfl: 0   gabapentin (NEURONTIN) 300 MG capsule, Take 300 mg by mouth daily., Disp: , Rfl:    ibuprofen (ADVIL,MOTRIN) 800 MG tablet, Take 800 mg by mouth every 8 (eight) hours as needed., Disp: , Rfl:    mupirocin ointment (BACTROBAN) 2 %, APPLY TO THE AFFECTED AREA BID, Disp: , Rfl:    sildenafil (VIAGRA) 100 MG tablet, TAKE ONE TABLET BY MOUTH ONE  TIME DAILY, ONE HOUR PRIOR TO INTERCOURSE, Disp: 30 tablet, Rfl: 4  ROS  Constitutional: Denies any fever or chills Gastrointestinal: No reported hemesis, hematochezia, vomiting, or acute GI distress Musculoskeletal: Denies any acute onset joint swelling, redness, loss of ROM, or weakness Neurological: No reported episodes of acute onset apraxia, aphasia, dysarthria, agnosia, amnesia, paralysis, loss of coordination, or loss of consciousness  Allergies  Jared Wright is allergic to ciprofloxacin, elemental sulfur, and sulfamethoxazole-trimethoprim.  PFSH  Drug: Jared Wright  reports no history of drug use. Alcohol:  reports that he does not currently use alcohol. Tobacco:  reports that he has quit smoking. His smoking use included cigarettes. He has never used smokeless tobacco. Medical:  has a past medical history of Acid reflux, History of kidney stones, and Renal disorder. Surgical: Jared Wright  has a past surgical history that includes Esophageal dilation and Knee  arthroscopy. Family: family history is not on file.  Constitutional Exam  General appearance: Well nourished, well developed, and well hydrated. In no apparent acute distress Vitals:   08/12/22 0809  BP: 117/78  Pulse: 71  Temp: 98.4 F (36.9 C)  SpO2: 100%  Weight: 186 lb (84.4 kg)  Height: 5\' 8"  (1.727 m)   BMI Assessment: Estimated body mass index is 28.28 kg/m as calculated from the following:   Height as of this encounter: 5\' 8"  (1.727 m).   Weight as of this encounter: 186 lb (84.4 kg).  BMI interpretation table: BMI level Category Range association with higher incidence of chronic pain  <18 kg/m2 Underweight   18.5-24.9 kg/m2 Ideal body weight   25-29.9 kg/m2 Overweight Increased incidence by 20%  30-34.9 kg/m2 Obese (Class I) Increased incidence by 68%  35-39.9 kg/m2 Severe obesity (Class II) Increased incidence by 136%  >40 kg/m2 Extreme obesity (Class III) Increased incidence by 254%   Patient's  current BMI Ideal Body weight  Body mass index is 28.28 kg/m. Ideal body weight: 68.4 kg (150 lb 12.7 oz) Adjusted ideal body weight: 74.8 kg (164 lb 14 oz)   BMI Readings from Last 4 Encounters:  08/12/22 28.28 kg/m  07/27/22 28.28 kg/m  11/05/21 28.13 kg/m  08/29/21 29.65 kg/m   Wt Readings from Last 4 Encounters:  08/12/22 186 lb (84.4 kg)  07/27/22 186 lb (84.4 kg)  11/05/21 185 lb (83.9 kg)  08/29/21 195 lb (88.5 kg)    Psych/Mental status: Alert, oriented x 3 (person, place, & time)       Eyes: PERLA Respiratory: No evidence of acute respiratory distress  Assessment & Plan  Primary Diagnosis & Pertinent Problem List: The primary encounter diagnosis was Chronic low back pain (1ry area of Pain) (Bilateral) (L>R) w/o sciatica. Diagnoses of Lumbar facet joint pain, Lumbosacral facet joint syndrome, Grade 1 Lumbar Retrolisthesis of L1/L2, L2/L3, & L3/L4, Grade 1 Lumbar Anterolisthesis of L4/L5, Grade 1 Lumbosacral Anterolisthesis of L5/S1, Lumbosacral facet arthropathy (Multilevel) (Bilateral), Osteoarthritis of lumbar spine, Levoscoliosis of lumbar spine, DDD (degenerative disc disease), lumbar, Abnormal MRI, lumbar spine (11/05/2021), Chronic neck (2ry area of Pain) (Bilateral) and back pain (Right), DDD (degenerative disc disease), cervical, Cervical facet hypertrophy (Multilevel) (Bilateral), Cervical foraminal stenosis (C6-7) (Bilateral), Osteoarthritis of hand (Left), Osteoarthritis of hand (Right), and Osteoarthritis of hands (Bilateral) were also pertinent to this visit.  Visit Diagnosis: 1. Chronic low back pain (1ry area of Pain) (Bilateral) (L>R) w/o sciatica   2. Lumbar facet joint pain   3. Lumbosacral facet joint syndrome   4. Grade 1 Lumbar Retrolisthesis of L1/L2, L2/L3, & L3/L4   5. Grade 1 Lumbar Anterolisthesis of L4/L5   6. Grade 1 Lumbosacral Anterolisthesis of L5/S1   7. Lumbosacral facet arthropathy (Multilevel) (Bilateral)   8. Osteoarthritis of lumbar  spine   9. Levoscoliosis of lumbar spine   10. DDD (degenerative disc disease), lumbar   11. Abnormal MRI, lumbar spine (11/05/2021)   12. Chronic neck (2ry area of Pain) (Bilateral) and back pain (Right)   13. DDD (degenerative disc disease), cervical   14. Cervical facet hypertrophy (Multilevel) (Bilateral)   15. Cervical foraminal stenosis (C6-7) (Bilateral)   16. Osteoarthritis of hand (Left)   17. Osteoarthritis of hand (Right)   18. Osteoarthritis of hands (Bilateral)    Problems updated and reviewed during this visit: Problem  Cervical facet hypertrophy (Multilevel) (Bilateral)  Osteoarthritis of hand (Left)   Diagnostic x-rays of the left hand demonstrated osteoarthritis of  the thumb, distal interphalangeal joints, and third digit metacarpophalangeal joint.  The fifth digit is held in flexion at the proximal interphalangeal joint on all views.     Osteoarthritis of hand (Right)   Diagnostic x-rays of the right hand again show osteoarthritis of the distal interphalangeal joints as well as metacarpophalangeal joints of the thumb and third digit.  The fifth digit is held in flexion at the proximal interphalangeal joint on all views.     Osteoarthritis of lumbar spine   Diagnostic x-rays of the lumbar spine with bending views show multilevel degenerative disc disease, L4-5 and L5-S1 facet hypertrophy, mild degenerative retrolisthesis of L1 over L2 and L2 over L3 which is unchanged on flexion and extension views.  There is also grade 1 retrolisthesis of L3 over L4 which is 2 mm in the neutral position and 0 mm on flexion and extension.  Trace degenerative anterolisthesis of L4 over L5 (unchanged on flexion and extension views).  There is levoscoliosis centered at L1-2.     Lumbosacral facet arthropathy (Multilevel) (Bilateral)  Lumbosacral Facet Joint Syndrome  Grade 1 Lumbar Retrolisthesis of L1/L2, L2/L3, & L3/L4   Trace degenerative retrolisthesis at L1-L2, L2-L3, and L3-L4.   Diagnostic x-rays of the lumbar spine with bending views show mild degenerative retrolisthesis of L1 over L2 and L2 over L3 which is unchanged on flexion and extension views.  There is also grade 1 retrolisthesis of L3 over L4 which is 2 mm in the neutral position and 0 mm on flexion and extension. There is levoscoliosis centered at L1-2.     Grade 1 Lumbar Anterolisthesis of L4/L5   Trace degenerative anterolisthesis at L4-L5 and L5-S1. Levoconvex curvature. Diagnostic x-rays of the lumbar spine with bending views show anterolisthesis of L4 over L5 (unchanged on flexion and extension views).  There is levoscoliosis centered at L1-2.     Levoscoliosis of Lumbar Spine   Diagnostic x-rays of the lumbar spine There is levoscoliosis centered at L1-2.     Abnormal MRI, lumbar spine (11/05/2021)   (11/05/2021) LUMBAR MRI FINDINGS: Alignment: Trace degenerative retrolisthesis at L1-L2, L2-L3, and L3-L4. Trace degenerative anterolisthesis at L4-L5 and L5-S1. Levoconvex curvature. Vertebrae: There is degenerative endplate edema at [id number] and L1-2.  DISC LEVELS: T12-L1: Minimal disc bulging. L1-2: Asymmetric left disc bulging, ligamentum hypertrophy and mild facet arthropathy. There is mild left lateral recess and neural foraminal narrowing. Mild right neural foraminal narrowing predominantly due to levoconvex curvature.  L2-3: Trace retrolisthesis with mild disc bulging, ligamentum hypertrophy and mild facet arthropathy. There is moderate right lateral recess stenosis with potential for impingement of the descending right L3 nerve root.  L3-4: Trace retrolisthesis with minimal disc bulging. There is mild bilateral lateral recess stenosis without impingement.  L4-5: Trace degenerative anterolisthesis with minimal disc bulging, ligamentum hypertrophy and mild facet arthropathy. There is lateral recess stenosis bilaterally without impingement. There is mild-to-moderate left neural foraminal  stenosis. L5-S1: Minimal disc bulging with endplate spurring, ligamentum hypertrophy and bilateral facet arthropathy. There is moderate left-sided neural foraminal stenosis and abutment of the left exiting nerve root by endplate spurring. Mild right neural foraminal stenosis.  IMPRESSION: L1-2: Mild left lateral recess and neural foraminal stenosis. Mild right neural foraminal narrowing predominantly due to levoconvex curvature. L2-3: Moderate right lateral recess stenosis with potential for impingement of the descending right L3 nerve root. L3-4: Mild lateral recess stenosis bilaterally without neural impingement. L4-5: Mild lateral recess stenosis bilaterally without neural impingement. Mild to moderate left neural foraminal stenosis.  L5-S1: Moderate left-sided neural foraminal stenosis and abutment of left exiting nerve root by endplate spurring. Mild right neural foraminal stenosis.   Osteoarthritis of hands (Bilateral)   Diagnostic x-rays of the left hand demonstrated osteoarthritis of the thumb, distal interphalangeal joints, and third digit metacarpophalangeal joint.  The fifth digit is held in flexion at the proximal interphalangeal joint on all views.   Diagnostic x-rays of the right hand again show osteoarthritis of the distal interphalangeal joints as well as metacarpophalangeal joints of the thumb and third digit.  The fifth digit is held in flexion at the proximal interphalangeal joint on all views.     Cervical foraminal stenosis (C6-7) (Bilateral)  Gerd (Gastroesophageal Reflux Disease)    Plan of Care  Pharmacotherapy (Medications Ordered): No orders of the defined types were placed in this encounter.  Procedure Orders         LUMBAR FACET(MEDIAL BRANCH NERVE BLOCK) MBNB     Lab Orders  No laboratory test(s) ordered today   Imaging Orders  No imaging studies ordered today   Referral Orders  No referral(s) requested today    Pharmacological management:  Opioid  Analgesics: I will not be prescribing any opioids at this time Membrane stabilizer: I will not be prescribing any at this time Muscle relaxant: I will not be prescribing any at this time NSAID: I will not be prescribing any at this time Other analgesic(s): I will not be prescribing any at this time      Interventional Therapies  Risk Factors  Considerations:  GERD  Seizure disorder  Hx. Vesovagal     Planned  Pending:      Under consideration:   Diagnostic bilateral lumbar facet MBB #1  Therapeutic LESI  Therapeutic TFESI  Therapeutic cervical ESI  Diagnostic cervical facet MBB    Completed:   None at this time   Therapeutic  Palliative (PRN) options:   None established   Completed by other providers:   None reported       Provider-requested follow-up: Return for (ECT): (B) L-FCT Blk #1. Recent Visits Date Type Provider Dept  07/27/22 Office Visit Delano Metz, MD [facility]  Showing recent visits within past 90 days and meeting all other requirements Today's Visits Date Type Provider Dept  08/12/22 Office Visit Delano Metz, MD [facility]  Showing today's visits and meeting all other requirements Future Appointments No visits were found meeting these conditions. Showing future appointments within next 90 days and meeting all other requirements   Primary Care Physician: Marina Goodell, MD  Duration of encounter: 77 minutes.  Total time on encounter, as per AMA guidelines included both the face-to-face and non-face-to-face time personally spent by the physician and/or other qualified health care professional(s) on the day of the encounter (includes time in activities that require the physician or other qualified health care professional and does not include time in activities normally performed by clinical staff). Physician's time may include the following activities when performed: Preparing to see the patient (e.g.,  pre-charting review of records, searching for previously ordered imaging, lab work, and nerve conduction tests) Review of prior analgesic pharmacotherapies. Reviewing PMP Interpreting ordered tests (e.g., lab work, imaging, nerve conduction tests) Performing post-procedure evaluations, including interpretation of diagnostic procedures Obtaining and/or reviewing separately obtained history Performing a medically appropriate examination and/or evaluation Counseling and educating the patient/family/caregiver Ordering medications, tests, or procedures Referring and communicating with other health care professionals (when not separately reported) Documenting clinical information in the  electronic or other health record Independently interpreting results (not separately reported) and communicating results to the patient/ family/caregiver Care coordination (not separately reported)  Note by: Oswaldo Done, MD (TTS technology used. I apologize for any typographical errors that were not detected and corrected.) Date: 08/12/2022; Time: 9:05 AM

## 2022-08-12 ENCOUNTER — Encounter: Payer: Self-pay | Admitting: Pain Medicine

## 2022-08-12 ENCOUNTER — Ambulatory Visit: Payer: BC Managed Care – PPO | Attending: Pain Medicine | Admitting: Pain Medicine

## 2022-08-12 VITALS — BP 117/78 | HR 71 | Temp 98.4°F | Ht 68.0 in | Wt 186.0 lb

## 2022-08-12 DIAGNOSIS — M4316 Spondylolisthesis, lumbar region: Secondary | ICD-10-CM | POA: Diagnosis not present

## 2022-08-12 DIAGNOSIS — G8929 Other chronic pain: Secondary | ICD-10-CM | POA: Diagnosis present

## 2022-08-12 DIAGNOSIS — M4317 Spondylolisthesis, lumbosacral region: Secondary | ICD-10-CM

## 2022-08-12 DIAGNOSIS — M5030 Other cervical disc degeneration, unspecified cervical region: Secondary | ICD-10-CM | POA: Diagnosis present

## 2022-08-12 DIAGNOSIS — M19041 Primary osteoarthritis, right hand: Secondary | ICD-10-CM | POA: Diagnosis present

## 2022-08-12 DIAGNOSIS — R937 Abnormal findings on diagnostic imaging of other parts of musculoskeletal system: Secondary | ICD-10-CM

## 2022-08-12 DIAGNOSIS — M19042 Primary osteoarthritis, left hand: Secondary | ICD-10-CM | POA: Diagnosis present

## 2022-08-12 DIAGNOSIS — M5136 Other intervertebral disc degeneration, lumbar region: Secondary | ICD-10-CM | POA: Diagnosis present

## 2022-08-12 DIAGNOSIS — M4186 Other forms of scoliosis, lumbar region: Secondary | ICD-10-CM

## 2022-08-12 DIAGNOSIS — M51369 Other intervertebral disc degeneration, lumbar region without mention of lumbar back pain or lower extremity pain: Secondary | ICD-10-CM

## 2022-08-12 DIAGNOSIS — M47816 Spondylosis without myelopathy or radiculopathy, lumbar region: Secondary | ICD-10-CM | POA: Diagnosis not present

## 2022-08-12 DIAGNOSIS — M47812 Spondylosis without myelopathy or radiculopathy, cervical region: Secondary | ICD-10-CM | POA: Diagnosis present

## 2022-08-12 DIAGNOSIS — M542 Cervicalgia: Secondary | ICD-10-CM | POA: Insufficient documentation

## 2022-08-12 DIAGNOSIS — M549 Dorsalgia, unspecified: Secondary | ICD-10-CM | POA: Diagnosis present

## 2022-08-12 DIAGNOSIS — M4802 Spinal stenosis, cervical region: Secondary | ICD-10-CM | POA: Diagnosis present

## 2022-08-12 DIAGNOSIS — M545 Low back pain, unspecified: Secondary | ICD-10-CM | POA: Diagnosis present

## 2022-08-12 DIAGNOSIS — M47817 Spondylosis without myelopathy or radiculopathy, lumbosacral region: Secondary | ICD-10-CM

## 2022-08-12 DIAGNOSIS — M5459 Other low back pain: Secondary | ICD-10-CM | POA: Diagnosis present

## 2022-08-12 DIAGNOSIS — M4310 Spondylolisthesis, site unspecified: Secondary | ICD-10-CM

## 2022-08-12 NOTE — Progress Notes (Signed)
Safety precautions to be maintained throughout the outpatient stay will include: orient to surroundings, keep bed in low position, maintain call bell within reach at all times, provide assistance with transfer out of bed and ambulation.  

## 2022-08-12 NOTE — Patient Instructions (Addendum)
______________________________________________________________________  Procedure instructions  Do not eat or drink fluids (other than water) for 6 hours before your procedure  No water for 2 hours before your procedure  Take your blood pressure medicine with a sip of water  Arrive 30 minutes before your appointment  Carefully read the "Preparing for your procedure" detailed instructions  If you have questions call us at [phone]  _____________________________________________________________________    ______________________________________________________________________  Preparing for your procedure  Appointments: If you think you may not be able to keep your appointment, call 24-48 hours in advance to cancel. We need time to make it available to others.  During your procedure appointment there will be: No Prescription Refills. No disability issues to discussed. No medication changes or discussions.  Instructions: Food intake: Avoid eating anything solid for at least 8 hours prior to your procedure. Clear liquid intake: You may take clear liquids such as water up to 2 hours prior to your procedure. (No carbonated drinks. No soda.) Transportation: Unless otherwise stated by your physician, bring a driver. Morning Medicines: Except for blood thinners, take all of your other morning medications with a sip of water. Make sure to take your heart and blood pressure medicines. If your blood pressure's lower number is above 100, the case will be rescheduled. Blood thinners: Make sure to stop your blood thinners as instructed.  If you take a blood thinner, but were not instructed to stop it, call our office [phone] and ask to talk to a nurse. Not stopping a blood thinner prior to certain procedures could lead to serious complications. Diabetics on insulin: Notify the staff so that you can be scheduled 1st case in the morning. If your diabetes requires high dose insulin,  take only  of your normal insulin dose the morning of the procedure and notify the staff that you have done so. Preventing infections: Shower with an antibacterial soap the morning of your procedure.  Build-up your immune system: Take 1000 mg of Vitamin C with every meal (3 times a day) the day prior to your procedure. Antibiotics: Inform the nursing staff if you are taking any antibiotics or if you have any conditions that may require antibiotics prior to procedures. (Example: recent joint implants)   Pregnancy: If you are pregnant make sure to notify the nursing staff. Not doing so may result in injury to the fetus, including death.  Sickness: If you have a cold, fever, or any active infections, call and cancel or reschedule your procedure. Receiving steroids while having an infection may result in complications. Arrival: You must be in the facility at least 30 minutes prior to your scheduled procedure. Tardiness: Your scheduled time is also the cutoff time. If you do not arrive at least 15 minutes prior to your procedure, you will be rescheduled.  Children: Do not bring any children with you. Make arrangements to keep them home. Dress appropriately: There is always a possibility that your clothing may get soiled. Avoid long dresses. Valuables: Do not bring any jewelry or valuables.  Reasons to call and reschedule or cancel your procedure: (Following these recommendations will minimize the risk of a serious complication.) Surgeries: Avoid having procedures within 2 weeks of any surgery. (Avoid for 2 weeks before or after any surgery). Flu Shots: Avoid having procedures within 2 weeks of a flu shots or . (Avoid for 2 weeks before or after immunizations). Barium: Avoid having a procedure within 7-10 days after having had a radiological study involving the use of   radiological contrast. (Myelograms, Barium swallow or enema study). Heart attacks: Avoid any elective procedures or surgeries for the  initial 6 months after a "Myocardial Infarction" (Heart Attack). Blood thinners: It is imperative that you stop these medications before procedures. Let us know if you if you take any blood thinner.  Infection: Avoid procedures during or within two weeks of an infection (including chest colds or gastrointestinal problems). Symptoms associated with infections include: Localized redness, fever, chills, night sweats or profuse sweating, burning sensation when voiding, cough, congestion, stuffiness, runny nose, sore throat, diarrhea, nausea, vomiting, cold or Flu symptoms, recent or current infections. It is specially important if the infection is over the area that we intend to treat. Heart and lung problems: Symptoms that may suggest an active cardiopulmonary problem include: cough, chest pain, breathing difficulties or shortness of breath, dizziness, ankle swelling, uncontrolled high or unusually low blood pressure, and/or palpitations. If you are experiencing any of these symptoms, cancel your procedure and contact your primary care physician for an evaluation.  Remember:  Regular Business hours are:  Monday to Thursday 8:00 AM to 4:00 PM  Provider's Schedule: Milinda Pointer, MD:  Procedure days: Tuesday and Thursday 7:30 AM to 4:00 PM  Gillis Santa, MD:  Procedure days: Monday and Wednesday 7:30 AM to 4:00 PM  ______________________________________________________________________    ____________________________________________________________________________________________  General Risks and Possible Complications  Patient Responsibilities: It is important that you read this as it is part of your informed consent. It is our duty to inform you of the risks and possible complications associated with treatments offered to you. It is your responsibility as a patient to read this and to ask questions about anything that is not clear or that you believe was not covered in this  document.  Patient's Rights: You have the right to refuse treatment. You also have the right to change your mind, even after initially having agreed to have the treatment done. However, under this last option, if you wait until the last second to change your mind, you may be charged for the materials used up to that point.  Introduction: Medicine is not an Chief Strategy Officer. Everything in Medicine, including the lack of treatment(s), carries the potential for danger, harm, or loss (which is by definition: Risk). In Medicine, a complication is a secondary problem, condition, or disease that can aggravate an already existing one. All treatments carry the risk of possible complications. The fact that a side effects or complications occurs, does not imply that the treatment was conducted incorrectly. It must be clearly understood that these can happen even when everything is done following the highest safety standards.  No treatment: You can choose not to proceed with the proposed treatment alternative. The "PRO(s)" would include: avoiding the risk of complications associated with the therapy. The "CON(s)" would include: not getting any of the treatment benefits. These benefits fall under one of three categories: diagnostic; therapeutic; and/or palliative. Diagnostic benefits include: getting information which can ultimately lead to improvement of the disease or symptom(s). Therapeutic benefits are those associated with the successful treatment of the disease. Finally, palliative benefits are those related to the decrease of the primary symptoms, without necessarily curing the condition (example: decreasing the pain from a flare-up of a chronic condition, such as incurable terminal cancer).  General Risks and Complications: These are associated to most interventional treatments. They can occur alone, or in combination. They fall under one of the following six (6) categories: no benefit or worsening of symptoms;  bleeding; infection; nerve damage; allergic reactions; and/or death. No benefits or worsening of symptoms: In Medicine there are no guarantees, only probabilities. No healthcare provider can ever guarantee that a medical treatment will work, they can only state the probability that it may. Furthermore, there is always the possibility that the condition may worsen, either directly, or indirectly, as a consequence of the treatment. Bleeding: This is more common if the patient is taking a blood thinner, either prescription or over the counter (example: Goody Powders, Fish oil, Aspirin, Garlic, etc.), or if suffering a condition associated with impaired coagulation (example: Hemophilia, cirrhosis of the liver, low platelet counts, etc.). However, even if you do not have one on these, it can still happen. If you have any of these conditions, or take one of these drugs, make sure to notify your treating physician. Infection: This is more common in patients with a compromised immune system, either due to disease (example: diabetes, cancer, human immunodeficiency virus [HIV], etc.), or due to medications or treatments (example: therapies used to treat cancer and rheumatological diseases). However, even if you do not have one on these, it can still happen. If you have any of these conditions, or take one of these drugs, make sure to notify your treating physician. Nerve Damage: This is more common when the treatment is an invasive one, but it can also happen with the use of medications, such as those used in the treatment of cancer. The damage can occur to small secondary nerves, or to large primary ones, such as those in the spinal cord and brain. This damage may be temporary or permanent and it may lead to impairments that can range from temporary numbness to permanent paralysis and/or brain death. Allergic Reactions: Any time a substance or material comes in contact with our body, there is the possibility of an  allergic reaction. These can range from a mild skin rash (contact dermatitis) to a severe systemic reaction (anaphylactic reaction), which can result in death. Death: In general, any medical intervention can result in death, most of the time due to an unforeseen complication. ____________________________________________________________________________________________  TENS (Device can be purchased "online", without prescription. Search: "TENS 7000".) Transcutaneous electrical nerve stimulation (TENS) is a method of pain relief involving the use of a mild electrical current. A TENS machine is a small, battery-operated device that has leads connected to sticky pads called electrodes.   Rechargeable 9V batteries:     Electrode placement:   TENS UNIT SAFETY WARNING SHEET and INFORMATION INDICATIONS AND CONTRAINDICTIONS Read the operation manual before using the device. Freight forwarder ([place]) restricts this device to sale by or on the order of a physician. Observe your physician's precise instructions and let him show you where to apply the electrodes. For a successful therapy, the correct application of the electrodes is an important factor. Carefully write down the settings your physician recommended. Indications for use This device is a prescription device and only for symptomatic relief of chronic intractable pain. Contraindications:   Any electrode placement that applies current to the carotid sinus (neck) region.   Patients with implanted electronic devices (for example, a pacemaker) or metallic implants should not undertake.   Any electrode placement that causes current to flow transcerebrally (through the head). The use of unit whenever pain symptoms are undiagnosed, unit etiology is determined.   The use of TENS whenever pain syndromes are undiagnosed, until etiology is established.  WARNINGS AND PRECAUTIONS  Warnings:   The device must be kept out of  reach of children.   The safety of device for  use during pregnancy or delivery has not been established.   Do not place electrodes on front of the throat. This may result in spasms of the laryngeal and pharyngeal muscles.   Do not place the electrodes over the carotid nerve (side of neck below ear).   The device is not effective for pain of central origin (headaches).   The device may interfere with electronic monitoring equipment (such as ECG monitors and ECG alarms).   Electrodes should not be placed over the eyes, in the mouth, or internally.   These devices have no curative value.   TENS devices should be used only under the continued supervision of a physician.   TENS is a symptomatic treatment and as such suppresses the sensation of pain which would otherwise serve as a protective mechanism. Precautions/Adverse Reactions   Isolated cases of skin irritation may occur at the site of electrode placement following long-term application.   Stimulation should be stopped and electrodes removed until the cause of the irritation can be determined.   Effectiveness is highly dependent upon patient selection by a person qualified in the management of pain patients.   If the device treatment becomes ineffective or unpleasant, stimulation should be discontinued until reevaluation by a physician/clinician.   Always turn the device off before applying or removing electrodes.   Skin irritation and electrode burns are potential adverse reactions.  PURPOSE: A Transcutaneous Electrical Nerve Stimulator, or TENS, unit is designed to relieve post-operative, acute and chronic pain. It is used for pain caused by peripheral nerves and not central. TENS units are prescription-only devices.  OPERATION: TENS units work in a couple of ways. The first way they are thought to work is by a method called the [employer]. The [employer] states that our brains can only handle one stimulus at a time. When you have chronic pain, this pain signal is constantly being sent to your brain and  recognized as pain. When an electrical stimulus is added to the area of pain the body feels this electrical stimulus, and since the brain can only handle one thing at a time, the pain is not transmitted to the brain. The second method thought to be part of TENS unit's success is by way of stimulating our own bodies to release their own natural painkillers. TENS units do not work for everyone and results may vary. Always follow the instructions and warnings in your user's manual.  USE: One of the most important tasks that must be performed is battery maintenance. If you are using a Engineering geologist, always fully charge it and fully deplete it before charging it again. These batteries can develop memories and by not performing this charging task correctly, your battery's life can be greatly diminished. If your battery does develop a memory you can help expand the memory by charging for 12 - 13 hours and then completely depleting the battery. Always prepare the skin before applying electrodes. Your skin should be clean and free of any lotions or creams. If you are using electrodes that use conductive gel, apply a small, even layer over the electrode. For carbon, self-adhesive electrodes, apply a drop of water to the electrodes before applying to the skin. The electrodes attach to the lead wires and then the TENS unit. Always grasp the connector and not the cord when inserting or removing. When making adjustments, always make sure the unit's channels (1 and 2) are in  the OFF position. The actual settings should be recommended and prescribed by your physician. Medical equipment suppliers don'tset or instruct users as to user settings. When you are using the BURST mode, the unit delivers a series of quick pulses followed by a rest. This cycle repeats itself frequently. Always have channels OFF before changing modes.  For MODULATION mode, the stimulation automatically varies the width of the pulse.   For CONVENTIONAL mode, the stimulation is constant. After the settings have been fine-tuned, set the timer to 30 or 60 minutes. Your physician should also prescribe the use time. When the lights become dim, it means your batteries should be replaced or recharged.  ACCESSORIES: The electrodes and lead wires can be obtained from your medical equipment supplier. Your medical equipment supplier can set up a recurring delivery to accommodate your needs. Electrodes should be replaced once a month and lead wires once every 6 months.  Video Tutorial https://youtu.be/V_quvXRrlQE?[email or web address]

## 2022-08-18 ENCOUNTER — Telehealth: Payer: Self-pay

## 2022-08-18 NOTE — Telephone Encounter (Signed)
Insurance Treatment Denial Note  Date order was entered:  Order entered by: Delano Metz, MD Requested treatment: lumbar facets Reason for denial: Documentation of Physical Therapy is missing. Recommended for approval:  PT, 6 weeks of treatement   I put the denial letter in your basket outside your office. They are requiring 6 weeks of treatement, and PT, meds etc.

## 2022-08-19 ENCOUNTER — Other Ambulatory Visit: Payer: Self-pay | Admitting: Pain Medicine

## 2022-08-19 DIAGNOSIS — M545 Low back pain, unspecified: Secondary | ICD-10-CM

## 2022-08-19 DIAGNOSIS — M4317 Spondylolisthesis, lumbosacral region: Secondary | ICD-10-CM

## 2022-08-19 DIAGNOSIS — M51369 Other intervertebral disc degeneration, lumbar region without mention of lumbar back pain or lower extremity pain: Secondary | ICD-10-CM

## 2022-08-19 DIAGNOSIS — M5136 Other intervertebral disc degeneration, lumbar region: Secondary | ICD-10-CM

## 2022-08-19 DIAGNOSIS — M4316 Spondylolisthesis, lumbar region: Secondary | ICD-10-CM

## 2022-08-19 DIAGNOSIS — M4310 Spondylolisthesis, site unspecified: Secondary | ICD-10-CM

## 2022-08-19 DIAGNOSIS — G8929 Other chronic pain: Secondary | ICD-10-CM

## 2022-08-19 DIAGNOSIS — M5459 Other low back pain: Secondary | ICD-10-CM

## 2022-08-19 DIAGNOSIS — M47817 Spondylosis without myelopathy or radiculopathy, lumbosacral region: Secondary | ICD-10-CM

## 2022-08-19 NOTE — Progress Notes (Signed)
6 weeks of physical therapy required by insurance company in order to approve treatments.

## 2022-08-24 NOTE — Telephone Encounter (Signed)
I have called the patient and left a vm last week. So far he has not called me back.

## 2022-08-25 NOTE — Telephone Encounter (Signed)
Called patient again, left vm to see if he wants to proceed with PT

## 2022-08-28 ENCOUNTER — Other Ambulatory Visit: Payer: Self-pay | Admitting: Urology

## 2022-08-28 DIAGNOSIS — E291 Testicular hypofunction: Secondary | ICD-10-CM

## 2022-08-31 ENCOUNTER — Other Ambulatory Visit: Payer: BC Managed Care – PPO

## 2022-08-31 DIAGNOSIS — E291 Testicular hypofunction: Secondary | ICD-10-CM

## 2022-09-01 LAB — TESTOSTERONE: Testosterone: 707 ng/dL (ref 264–916)

## 2022-09-02 LAB — HEMATOCRIT: Hematocrit: 46.3 % (ref 37.5–51.0)

## 2022-09-02 LAB — PSA: Prostate Specific Ag, Serum: 0.7 ng/mL (ref 0.0–4.0)

## 2022-09-04 ENCOUNTER — Ambulatory Visit: Payer: BC Managed Care – PPO | Admitting: Urology

## 2022-09-04 ENCOUNTER — Encounter: Payer: Self-pay | Admitting: Urology

## 2022-09-04 VITALS — BP 153/76 | HR 66 | Ht 68.0 in | Wt 180.0 lb

## 2022-09-04 DIAGNOSIS — E291 Testicular hypofunction: Secondary | ICD-10-CM

## 2022-09-04 DIAGNOSIS — N529 Male erectile dysfunction, unspecified: Secondary | ICD-10-CM | POA: Diagnosis not present

## 2022-09-04 NOTE — Progress Notes (Signed)
I, Jared Wright,acting as a scribe for Jared Altes, MD.,have documented all relevant documentation on the behalf of Jared Altes, MD,as directed by  Jared Altes, MD while in the presence of Jared Altes, MD.  09/04/2022 11:29 AM   Jared Wright May 23, 1965 [id number]  Referring provider: Marina Goodell, MD [address] [place],  [place] [address]  Chief Complaint  Patient presents with   Hypogonadism   Urologic history: 1.  Hypogonadism Clomid 25 mg daily Symptoms tiredness, fatigue, decreased libido   2.  Erectile dysfunction Sildenafil 100 mg  HPI: Jared Wright is a 57 y.o. male presents for annual follow-up of hypogonadism.   Stable on Clomid Notes good energy level and libido No bothersome LUTS Labs 08/31/22 testosterone 707 ng/dL, PSA 0.7, and hematocrit 46.3 Liver function tests performed 07/27/22 were normal with exception of mildly elevated AST which was initially noted.  PSA trend   Prostate Specific Ag, Serum  Latest Ref Rng 0.0 - 4.0 ng/mL  12/24/2017 0.7   08/14/2019 1.3   02/23/2020 0.9   02/24/2021 0.8   08/31/2022 0.7      PMH: Past Medical History:  Diagnosis Date   Acid reflux    History of kidney stones    Renal disorder     Surgical History: Past Surgical History:  Procedure Laterality Date   ESOPHAGEAL DILATION     KNEE ARTHROSCOPY      Home Medications:  Allergies as of 09/04/2022       Reactions   Ciprofloxacin    Other reaction(s): Unknown   Elemental Sulfur    Sulfamethoxazole-trimethoprim Palpitations   Also noted flu like aches        Medication List        Accurate as of September 04, 2022 11:29 AM. If you have any questions, ask your nurse or doctor.          Clomid 50 MG tablet Generic drug: clomiPHENE TAKE ONE-HALF TABLET BY MOUTH ONE TIME DAILY   cyclobenzaprine 10 MG tablet Commonly known as: FLEXERIL Take 1 tablet (10 mg total) by mouth 3 (three) times daily as needed for  muscle spasms.   gabapentin 300 MG capsule Commonly known as: NEURONTIN Take 300 mg by mouth daily.   ibuprofen 800 MG tablet Commonly known as: ADVIL Take 800 mg by mouth every 8 (eight) hours as needed.   mupirocin ointment 2 % Commonly known as: BACTROBAN APPLY TO THE AFFECTED AREA BID   sildenafil 100 MG tablet Commonly known as: VIAGRA TAKE ONE TABLET BY MOUTH ONE TIME DAILY, ONE HOUR PRIOR TO INTERCOURSE        Allergies:  Allergies  Allergen Reactions   Ciprofloxacin     Other reaction(s): Unknown   Elemental Sulfur    Sulfamethoxazole-Trimethoprim Palpitations    Also noted flu like aches    Social History:  reports that he has quit smoking. His smoking use included cigarettes. He has never used smokeless tobacco. He reports that he does not currently use alcohol. He reports that he does not use drugs.   Physical Exam: BP (!) 153/76   Pulse 66   Ht 5\' 8"  (1.727 m)   Wt 180 lb (81.6 kg)   BMI 27.37 kg/m   Constitutional:  Alert and oriented, No acute distress. HEENT: [place] AT, moist mucus membranes.  Trachea midline, no masses. Cardiovascular: No clubbing, cyanosis, or edema. Respiratory: Normal respiratory effort, no increased work of breathing. GI:  Abdomen is soft, nontender, nondistended, no abdominal masses Skin: No rashes, bruises or suspicious lesions. Neurologic: Grossly intact, no focal deficits, moving all 4 extremities. Psychiatric: Normal mood and affect.   Assessment & Plan:    1. Hypogonadism Doing well on Clomid Lab visit 6 months testosterone/H&H Office visit 1 year testosterone, H&H, PSA.   2. Erectile dysfunction He had several questions about newer medications for ED, specifically peptide 141 and oxytocin. I informed him I was not aware of these medications, but would do some research and get back to him. He does have some difficulty maintaining erection, which he thinks is more psychological, and discussed an adjustable venous  compression band- VenoSeal We also discussed shockwave therapy, which has variable results and is fairly expensive and not covered by insurance.  [facility] [address], [address] [place], [place] [address] [phone]

## 2022-09-23 ENCOUNTER — Telehealth: Payer: Self-pay | Admitting: Gastroenterology

## 2022-09-23 NOTE — Telephone Encounter (Signed)
Patient called in to schedule office visit with Dr.Wohl

## 2022-10-09 ENCOUNTER — Other Ambulatory Visit: Payer: Self-pay | Admitting: Urology

## 2022-10-21 ENCOUNTER — Ambulatory Visit: Payer: BC Managed Care – PPO | Admitting: Gastroenterology

## 2022-10-21 ENCOUNTER — Encounter: Payer: Self-pay | Admitting: Gastroenterology

## 2022-10-21 VITALS — BP 128/73 | HR 69 | Temp 98.2°F | Wt 182.0 lb

## 2022-10-21 DIAGNOSIS — K219 Gastro-esophageal reflux disease without esophagitis: Secondary | ICD-10-CM

## 2022-10-21 MED ORDER — OMEPRAZOLE 40 MG PO CPDR: 40.0000 mg | DELAYED_RELEASE_CAPSULE | Freq: Every day | ORAL | 5 refills | Status: DC

## 2022-10-21 NOTE — Progress Notes (Signed)
Gastroenterology Consultation  Referring Provider:     Marina Goodell, MD Primary Care Physician:  Jared Goodell, MD Primary Gastroenterologist:  Dr. Servando Snare     Reason for Consultation:     GERD        HPI:   Jared Wright is a 57 y.o. y/o male referred for consultation & management of GERD by Dr. Maryjane Wright, Jared Guthrie, MD. This patient comes in today after being seen by me back in 2010 and was referred here back in 2023 for early satiety.  From the chart review and Care Everywhere it does not seem that the patient has followed up with gastroenterology since 2010 for any of his symptoms. The patient reports that he is lost weight on a low-carb high-protein diet.  He does report that he feels like he can burp up the protein powders he drinks sometime later.  He denies any dysphagia at this time.  He does state that he has heartburn on a regular basis throughout the week and he takes his omeprazole as needed.  The patient was tried on pantoprazole and reported that that caused him to have worsening of his abdominal symptoms.  Past Medical History:  Diagnosis Date   Acid reflux    History of kidney stones    Renal disorder     Past Surgical History:  Procedure Laterality Date   ESOPHAGEAL DILATION     KNEE ARTHROSCOPY      Prior to Admission medications   Medication Sig Start Date End Date Taking? Authorizing Provider  CLOMID 50 MG tablet TAKE ONE-HALF TABLET BY MOUTH ONE TIME DAILY 10/09/22   Michiel Cowboy A, PA-C  cyclobenzaprine (FLEXERIL) 10 MG tablet Take 1 tablet (10 mg total) by mouth 3 (three) times daily as needed for muscle spasms. 11/05/21   Merwyn Katos, MD  gabapentin (NEURONTIN) 300 MG capsule Take 300 mg by mouth daily.    [provider]  ibuprofen (ADVIL,MOTRIN) 800 MG tablet Take 800 mg by mouth every 8 (eight) hours as needed.    [provider]  mupirocin ointment (BACTROBAN) 2 % APPLY TO THE AFFECTED AREA BID 06/23/18   [provider]  sildenafil (VIAGRA) 100 MG tablet TAKE ONE TABLET BY MOUTH ONE TIME DAILY, ONE HOUR PRIOR TO INTERCOURSE 05/05/22   Stoioff, Verna Czech, MD    No family history on file.   Social History   Tobacco Use   Smoking status: Former    Types: Cigarettes   Smokeless tobacco: Never  Vaping Use   Vaping status: Never Used  Substance Use Topics   Alcohol use: Not Currently   Drug use: Never    Allergies as of 10/21/2022 - Review Complete 09/04/2022  Allergen Reaction Noted   Ciprofloxacin  08/14/2013   Elemental sulfur  01/17/2016   Sulfamethoxazole-trimethoprim Palpitations 08/16/2013    Review of Systems:    All systems reviewed and negative except where noted in HPI.   Physical Exam:  There were no vitals taken for this visit. No LMP for male patient. General:   Alert,  Well-developed, well-nourished, pleasant and cooperative in NAD Head:  Normocephalic and atraumatic. Eyes:  Sclera clear, no icterus.   Conjunctiva pink. Ears:  Normal auditory acuity. Neck:  Supple; no masses or thyromegaly. Lungs:  Respirations even and unlabored.  Clear throughout to auscultation.   No wheezes, crackles, or rhonchi. No acute distress. Heart:  Regular rate and rhythm; no murmurs, clicks, rubs, or gallops. Abdomen:  Normal bowel sounds.  No bruits.  Soft, non-tender and non-distended without masses, hepatosplenomegaly or hernias noted.  No guarding or rebound tenderness.  Negative Carnett sign.   Rectal:  Deferred.  Pulses:  Normal pulses noted. Extremities:  No clubbing or edema.  No cyanosis. Neurologic:  Alert and oriented x3;  grossly normal neurologically. Skin:  Intact without significant lesions or rashes.  No jaundice. Lymph Nodes:  No significant cervical adenopathy. Psych:  Alert and cooperative. Normal mood and affect.  Imaging Studies: No results found.  Assessment and Plan:   Jared Wright is a 57 y.o. y/o male who comes in today with a history of GERD and  he has been instructed to take a PPI daily since he is having it quite frequently throughout the week.  The patient will be put on omeprazole 40 mg/day.  The patient will also be set up for screening colonoscopy and EGD due to his chronic reflux and the colonoscopy for screening.  The patient has been explained the plan and agrees with it.    Jared Minium, MD. Clementeen Graham    Note: This dictation was prepared with Dragon dictation along with smaller phrase technology. Any transcriptional errors that result from this process are unintentional.

## 2022-12-14 ENCOUNTER — Encounter: Payer: Self-pay | Admitting: Urology

## 2023-03-04 ENCOUNTER — Other Ambulatory Visit: Payer: Self-pay

## 2023-03-04 ENCOUNTER — Other Ambulatory Visit: Payer: BC Managed Care – PPO

## 2023-03-04 DIAGNOSIS — N529 Male erectile dysfunction, unspecified: Secondary | ICD-10-CM

## 2023-03-04 DIAGNOSIS — E291 Testicular hypofunction: Secondary | ICD-10-CM

## 2023-03-05 LAB — HEMOGLOBIN AND HEMATOCRIT, BLOOD
Hematocrit: 48 % (ref 37.5–51.0)
Hemoglobin: 15.8 g/dL (ref 13.0–17.7)

## 2023-03-05 LAB — TESTOSTERONE: Testosterone: 232 ng/dL — ABNORMAL LOW (ref 264–916)

## 2023-03-08 ENCOUNTER — Other Ambulatory Visit: Payer: Self-pay | Admitting: Urology

## 2023-03-08 MED ORDER — CLOMIPHENE CITRATE 50 MG PO TABS: ORAL_TABLET | ORAL | 2 refills | Status: DC

## 2023-05-13 ENCOUNTER — Other Ambulatory Visit: Payer: Self-pay

## 2023-05-13 MED ORDER — OMEPRAZOLE 40 MG PO CPDR: 40.0000 mg | DELAYED_RELEASE_CAPSULE | Freq: Every day | ORAL | 4 refills | Status: DC

## 2023-06-18 ENCOUNTER — Encounter: Payer: Self-pay | Admitting: Urology

## 2023-06-24 ENCOUNTER — Other Ambulatory Visit: Payer: Self-pay | Admitting: Urology

## 2023-06-24 ENCOUNTER — Other Ambulatory Visit: Payer: Self-pay | Admitting: *Deleted

## 2023-06-24 ENCOUNTER — Encounter: Payer: Self-pay | Admitting: Urology

## 2023-07-27 ENCOUNTER — Other Ambulatory Visit: Payer: Self-pay | Admitting: *Deleted

## 2023-07-27 ENCOUNTER — Encounter: Payer: Self-pay | Admitting: Gastroenterology

## 2023-07-27 MED ORDER — OMEPRAZOLE 40 MG PO CPDR: 40.0000 mg | DELAYED_RELEASE_CAPSULE | Freq: Every day | ORAL | 2 refills | Status: DC

## 2023-07-27 NOTE — Addendum Note (Signed)
 Addended by: Lovie Rudder on: 07/27/2023 03:31 PM   Modules accepted: Orders

## 2023-07-28 MED ORDER — CLOMIPHENE CITRATE 50 MG PO TABS: ORAL_TABLET | ORAL | 2 refills | Status: AC

## 2023-08-25 ENCOUNTER — Other Ambulatory Visit: Payer: Self-pay

## 2023-08-25 DIAGNOSIS — N529 Male erectile dysfunction, unspecified: Secondary | ICD-10-CM

## 2023-08-25 DIAGNOSIS — N5201 Erectile dysfunction due to arterial insufficiency: Secondary | ICD-10-CM

## 2023-08-25 DIAGNOSIS — E291 Testicular hypofunction: Secondary | ICD-10-CM

## 2023-08-27 ENCOUNTER — Other Ambulatory Visit

## 2023-08-27 DIAGNOSIS — N529 Male erectile dysfunction, unspecified: Secondary | ICD-10-CM

## 2023-08-27 DIAGNOSIS — N5201 Erectile dysfunction due to arterial insufficiency: Secondary | ICD-10-CM

## 2023-08-27 DIAGNOSIS — E291 Testicular hypofunction: Secondary | ICD-10-CM

## 2023-08-28 LAB — HEMOGLOBIN AND HEMATOCRIT, BLOOD
Hematocrit: 47.2 % (ref 37.5–51.0)
Hemoglobin: 16 g/dL (ref 13.0–17.7)

## 2023-08-28 LAB — PSA: Prostate Specific Ag, Serum: 0.5 ng/mL (ref 0.0–4.0)

## 2023-08-28 LAB — TESTOSTERONE: Testosterone: 1260 ng/dL — ABNORMAL HIGH (ref 264–916)

## 2023-08-30 ENCOUNTER — Other Ambulatory Visit: Payer: Self-pay

## 2023-08-30 ENCOUNTER — Ambulatory Visit: Admitting: Urology

## 2023-08-30 ENCOUNTER — Encounter: Payer: Self-pay | Admitting: Urology

## 2023-08-30 VITALS — BP 159/69 | HR 61 | Ht 68.0 in | Wt 187.0 lb

## 2023-08-30 DIAGNOSIS — N529 Male erectile dysfunction, unspecified: Secondary | ICD-10-CM | POA: Diagnosis not present

## 2023-08-30 DIAGNOSIS — E291 Testicular hypofunction: Secondary | ICD-10-CM | POA: Diagnosis not present

## 2023-08-30 NOTE — Progress Notes (Signed)
 08/30/2023 11:09 AM   Jared Wright 1965/06/19 [id number]  Referring provider: Jeffie Cheryl BRAVO, MD [address] [place],  [place] [address]  Chief Complaint  Patient presents with   Hypogonadism in male   Urologic history: 1.  Hypogonadism Clomid  25 mg daily Symptoms tiredness, fatigue, decreased libido   2.  Erectile dysfunction Sildenafil  100 mg  HPI: Jared Wright is a 59 y.o. male presents for annual follow-up of hypogonadism.   Stable on Clomid  Notes good energy level and libido No bothersome LUTS Labs 08/27/23 testosterone  1260 ng/dL, PSA 0.5 and hematocrit 47.2   PSA trend   Prostate Specific Ag, Serum  Latest Ref Rng 0.0 - 4.0 ng/mL  12/24/2017 0.7   08/14/2019 1.3   02/23/2020 0.9   02/24/2021 0.8   08/31/2022 0.7   08/27/2023 0.5     PMH: Past Medical History:  Diagnosis Date   Acid reflux    History of kidney stones    Renal disorder     Surgical History: Past Surgical History:  Procedure Laterality Date   ESOPHAGEAL DILATION     KNEE ARTHROSCOPY      Home Medications:  Allergies as of 08/30/2023       Reactions   Ciprofloxacin    Other reaction(s): Unknown   Elemental Sulfur    Sulfamethoxazole-trimethoprim Palpitations   Also noted flu like aches        Medication List        Accurate as of August 30, 2023 11:09 AM. If you have any questions, ask your nurse or doctor.          aminocaproic acid 500 MG tablet Commonly known as: AMICAR Take 500 mg by mouth every 6 (six) hours.   clomiPHENE  50 MG tablet Commonly known as: Clomid  TAKE ONE-HALF TABLET BY MOUTH ONE TIME DAILY   cyclobenzaprine  10 MG tablet Commonly known as: FLEXERIL  Take 1 tablet (10 mg total) by mouth 3 (three) times daily as needed for muscle spasms.   gabapentin  300 MG capsule Commonly known as: NEURONTIN  Take 300 mg by mouth daily.   multivitamin capsule Take 1 capsule by mouth daily.   mupirocin ointment 2 % Commonly known as:  BACTROBAN APPLY TO THE AFFECTED AREA BID   omeprazole  40 MG capsule Commonly known as: PRILOSEC Take 1 capsule (40 mg total) by mouth daily.   sildenafil  100 MG tablet Commonly known as: VIAGRA  TAKE ONE TABLET BY MOUTH ONE TIME DAILY, ONE HOUR PRIOR TO INTERCOURSE        Allergies:  Allergies  Allergen Reactions   Ciprofloxacin     Other reaction(s): Unknown   Elemental Sulfur    Sulfamethoxazole-Trimethoprim Palpitations    Also noted flu like aches    Social History:  reports that he has quit smoking. His smoking use included cigarettes. He has never used smokeless tobacco. He reports that he does not currently use alcohol. He reports that he does not use drugs.   Physical Exam: BP (!) 159/69   Pulse 61   Ht 5' 8 (1.727 m)   Wt 187 lb (84.8 kg)   BMI 28.43 kg/m   Constitutional:  Alert and oriented, No acute distress. HEENT: [place] AT Respiratory: Normal respiratory effort, no increased work of breathing. Psychiatric: Normal mood and affect.   Assessment & Plan:    1. Hypogonadism Doing well on Clomid  Recent T level slightly elevated and may be lab error Lab visit 6 months testosterone  Office visit 1 year  testosterone , H&H, PSA.   2. Erectile dysfunction Stable    Glendia JAYSON Barba, MD  [facility] [address] [place], [place] [address] [phone]

## 2023-09-03 ENCOUNTER — Ambulatory Visit: Payer: Self-pay | Admitting: Urology

## 2023-10-19 ENCOUNTER — Other Ambulatory Visit: Payer: Self-pay | Admitting: Gastroenterology

## 2024-01-21 ENCOUNTER — Other Ambulatory Visit: Payer: Self-pay | Admitting: Gastroenterology

## 2024-03-01 ENCOUNTER — Other Ambulatory Visit

## 2024-03-01 DIAGNOSIS — E291 Testicular hypofunction: Secondary | ICD-10-CM

## 2024-03-02 ENCOUNTER — Ambulatory Visit: Payer: Self-pay | Admitting: Urology

## 2024-03-02 LAB — TESTOSTERONE: Testosterone: 1289 ng/dL — ABNORMAL HIGH (ref 264–916)

## 2024-04-17 ENCOUNTER — Other Ambulatory Visit

## 2024-08-29 ENCOUNTER — Ambulatory Visit: Admitting: Urology
